# Patient Record
Sex: Male | Born: 1950 | Race: White | Hispanic: No | Marital: Married | Smoking: Former smoker
Health system: Southern US, Community
[De-identification: ages and names within clinical notes are randomized; demographics above are authoritative.]

## PROBLEM LIST (undated history)

## (undated) DIAGNOSIS — E291 Testicular hypofunction: Secondary | ICD-10-CM

## (undated) DIAGNOSIS — E118 Type 2 diabetes mellitus with unspecified complications: Secondary | ICD-10-CM

## (undated) DIAGNOSIS — C44621 Squamous cell carcinoma of skin of unspecified upper limb, including shoulder: Secondary | ICD-10-CM

## (undated) DIAGNOSIS — K5790 Diverticulosis of intestine, part unspecified, without perforation or abscess without bleeding: Secondary | ICD-10-CM

## (undated) DIAGNOSIS — N529 Male erectile dysfunction, unspecified: Secondary | ICD-10-CM

## (undated) DIAGNOSIS — E785 Hyperlipidemia, unspecified: Secondary | ICD-10-CM

## (undated) DIAGNOSIS — B029 Zoster without complications: Secondary | ICD-10-CM

## (undated) DIAGNOSIS — N200 Calculus of kidney: Secondary | ICD-10-CM

## (undated) DIAGNOSIS — K635 Polyp of colon: Secondary | ICD-10-CM

## (undated) HISTORY — DX: Zoster without complications: B02.9

## (undated) HISTORY — DX: Polyp of colon: K63.5

## (undated) HISTORY — DX: Male erectile dysfunction, unspecified: N52.9

## (undated) HISTORY — DX: Squamous cell carcinoma of skin of unspecified upper limb, including shoulder: C44.621

## (undated) HISTORY — PX: WISDOM TOOTH EXTRACTION: SHX21

## (undated) HISTORY — DX: Diverticulosis of intestine, part unspecified, without perforation or abscess without bleeding: K57.90

## (undated) HISTORY — DX: Calculus of kidney: N20.0

## (undated) HISTORY — DX: Type 2 diabetes mellitus with unspecified complications: E11.8

## (undated) HISTORY — PX: COLONOSCOPY: SHX174

## (undated) HISTORY — DX: Hyperlipidemia, unspecified: E78.5

## (undated) HISTORY — PX: POLYPECTOMY: SHX149

## (undated) HISTORY — PX: VASECTOMY: SHX75

## (undated) HISTORY — DX: Testicular hypofunction: E29.1

---

## 2003-08-16 ENCOUNTER — Encounter: Payer: Self-pay | Admitting: Internal Medicine

## 2005-06-16 ENCOUNTER — Ambulatory Visit: Payer: Self-pay | Admitting: Internal Medicine

## 2005-06-24 ENCOUNTER — Ambulatory Visit: Payer: Self-pay | Admitting: Internal Medicine

## 2005-07-06 ENCOUNTER — Ambulatory Visit: Payer: Self-pay | Admitting: Internal Medicine

## 2005-08-02 ENCOUNTER — Ambulatory Visit: Payer: Self-pay | Admitting: Internal Medicine

## 2005-08-23 ENCOUNTER — Encounter (INDEPENDENT_AMBULATORY_CARE_PROVIDER_SITE_OTHER): Payer: Self-pay | Admitting: Specialist

## 2005-08-23 ENCOUNTER — Ambulatory Visit: Payer: Self-pay | Admitting: Internal Medicine

## 2005-08-23 LAB — HM COLONOSCOPY

## 2005-08-25 ENCOUNTER — Ambulatory Visit: Payer: Self-pay | Admitting: Internal Medicine

## 2005-08-28 ENCOUNTER — Emergency Department (HOSPITAL_COMMUNITY): Admission: EM | Admit: 2005-08-28 | Discharge: 2005-08-28 | Payer: Self-pay | Admitting: Emergency Medicine

## 2005-09-01 ENCOUNTER — Ambulatory Visit: Payer: Self-pay | Admitting: Internal Medicine

## 2006-07-07 ENCOUNTER — Ambulatory Visit: Payer: Self-pay | Admitting: Internal Medicine

## 2006-07-07 LAB — CONVERTED CEMR LAB
ALT: 10 units/L (ref 0–40)
AST: 19 units/L (ref 0–37)
Albumin: 4.1 g/dL (ref 3.5–5.2)
Alkaline Phosphatase: 62 units/L (ref 39–117)
BUN: 12 mg/dL (ref 6–23)
Basophils Absolute: 0 10*3/uL (ref 0.0–0.1)
Basophils Relative: 0.1 % (ref 0.0–1.0)
CO2: 29 meq/L (ref 19–32)
Calcium: 9.3 mg/dL (ref 8.4–10.5)
Chloride: 105 meq/L (ref 96–112)
Chol/HDL Ratio, serum: 3.3
Cholesterol: 134 mg/dL (ref 0–200)
Creatinine, Ser: 1 mg/dL (ref 0.4–1.5)
Eosinophil percent: 2.2 % (ref 0.0–5.0)
GFR calc non Af Amer: 82 mL/min
Glomerular Filtration Rate, Af Am: 99 mL/min/{1.73_m2}
Glucose, Bld: 95 mg/dL (ref 70–99)
HCT: 45.8 % (ref 39.0–52.0)
HDL: 40.3 mg/dL (ref >39.0)
Hemoglobin: 15.7 g/dL (ref 13.0–17.0)
LDL Cholesterol: 82 mg/dL (ref 0–99)
Lymphocytes Relative: 37.3 % (ref 12.0–46.0)
MCHC: 34.2 g/dL (ref 30.0–36.0)
MCV: 94.9 fL (ref 78.0–100.0)
Monocytes Absolute: 0.4 10*3/uL (ref 0.2–0.7)
Monocytes Relative: 8.2 % (ref 3.0–11.0)
Neutro Abs: 2.8 10*3/uL (ref 1.4–7.7)
Neutrophils Relative %: 52.2 % (ref 43.0–77.0)
PSA: 0.98 ng/mL (ref 0.10–4.00)
Platelets: 198 10*3/uL (ref 150–400)
Potassium: 4.1 meq/L (ref 3.5–5.1)
RBC: 4.83 M/uL (ref 4.22–5.81)
RDW: 12.4 % (ref 11.5–14.6)
Sodium: 140 meq/L (ref 135–145)
TSH: 0.46 microintl units/mL (ref 0.35–5.50)
Total Bilirubin: 1.1 mg/dL (ref 0.3–1.2)
Total Protein: 7.1 g/dL (ref 6.0–8.3)
Triglyceride fasting, serum: 59 mg/dL (ref 0–149)
VLDL: 12 mg/dL (ref 0–40)
WBC: 5.2 10*3/uL (ref 4.5–10.5)

## 2006-08-01 IMAGING — CT CT ABDOMEN W/O CM
2 of 4 series · 16 of 46 positions shown, 18 images · non-contrast
Comparison: None.

CLINICAL DATA: Right flank pain and right-sided abdominal pain. Microscopic
hematuria. Nausea. Colonoscopy 5 days ago.

CT OF THE ABDOMEN AND PELVIS WITHOUT CONTRAST 08/28/2005:
TECHNIQUE: Multidetector helical CT through the abdomen and pelvis was performed
without intravenous contrast. Oral contrast was given.

[Series 2: stone_wo 5.0 b40f st · axial · 0.81mm/px · z∈[-492,-72]mm · 13 of 115 slices shown, 15 images]
[im 5/115  soft-tissue]
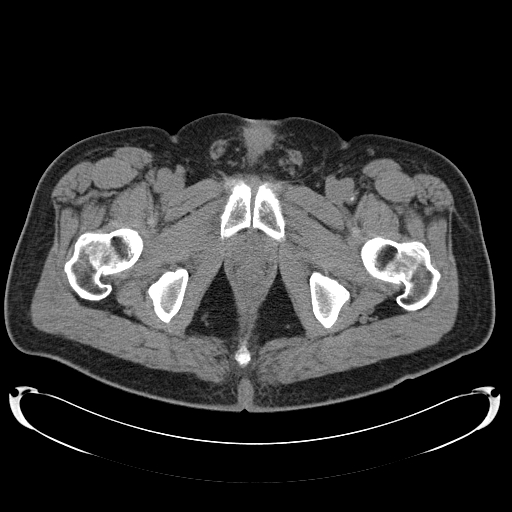
[im 5/115  bone]
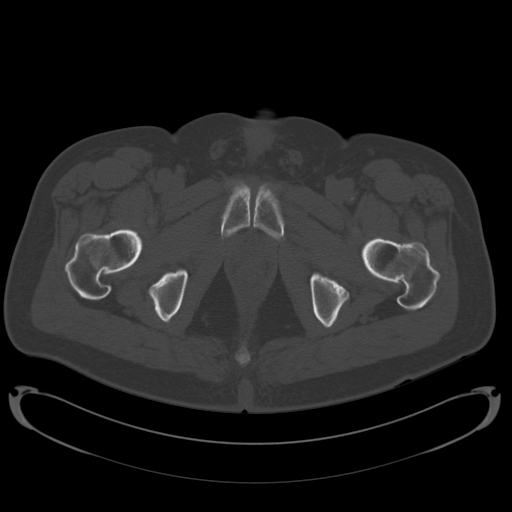
[im 15/115  soft-tissue]
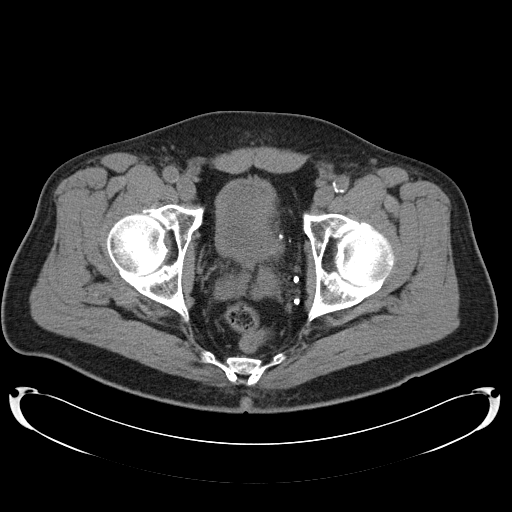
[im 24/115  soft-tissue]
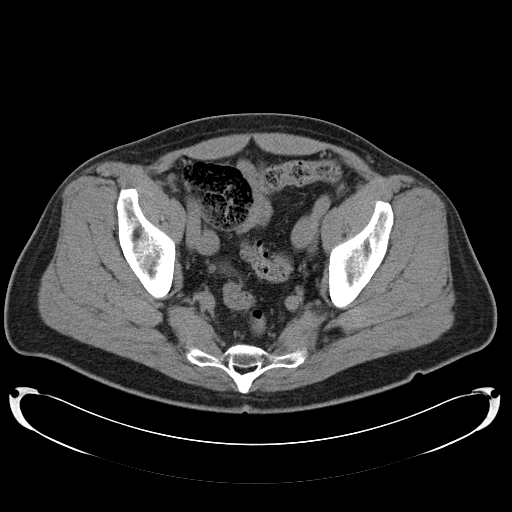
[im 34/115  soft-tissue]
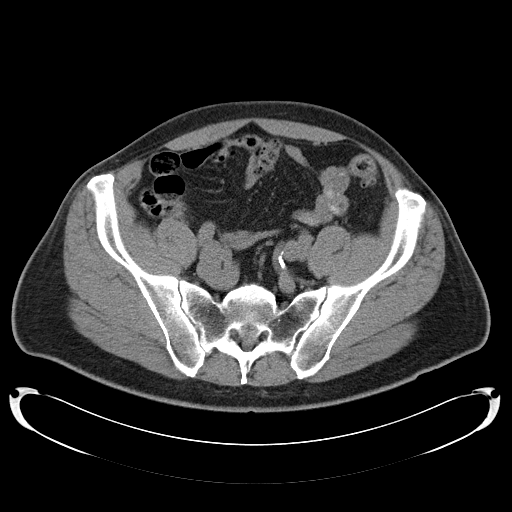
[im 39/115  soft-tissue]
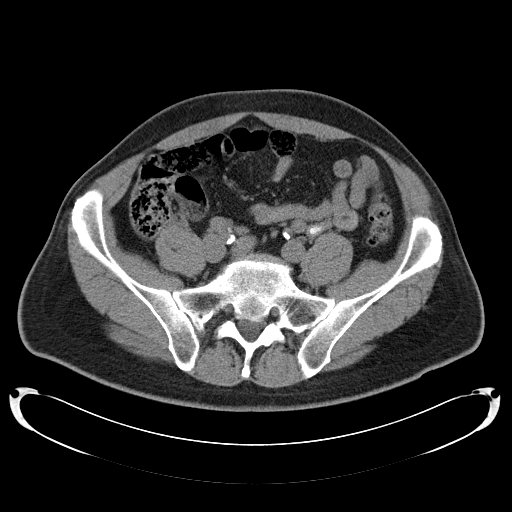
[im 48/115  soft-tissue]
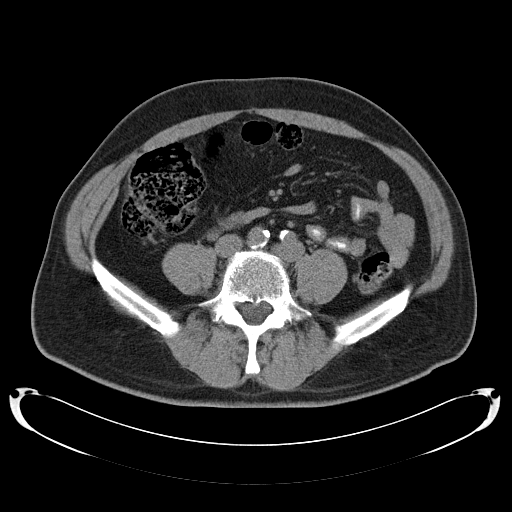
[im 58/115  soft-tissue]
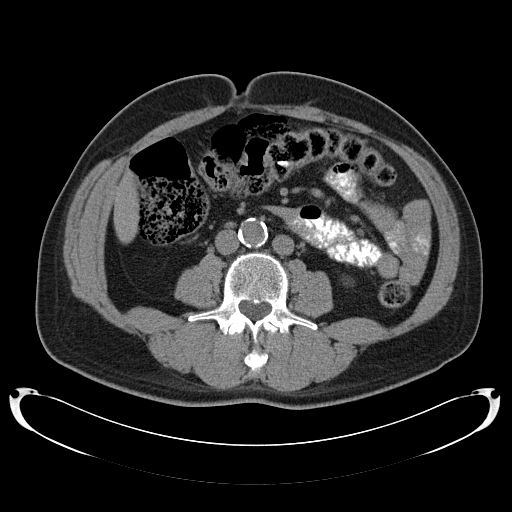
[im 67/115  soft-tissue]
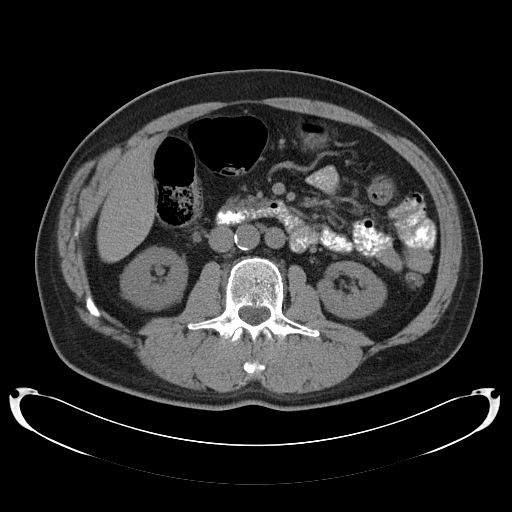
[im 77/115  soft-tissue]
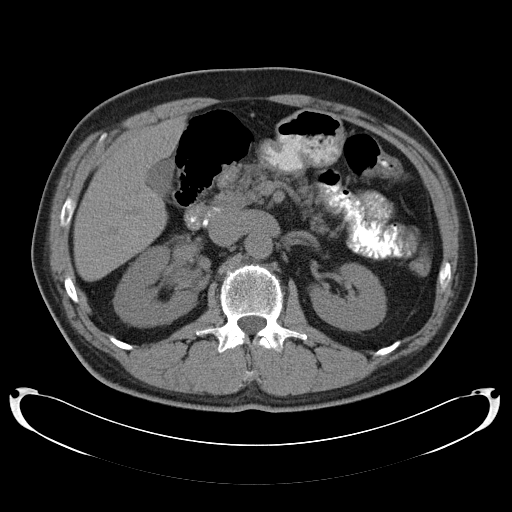
[im 77/115  bone]
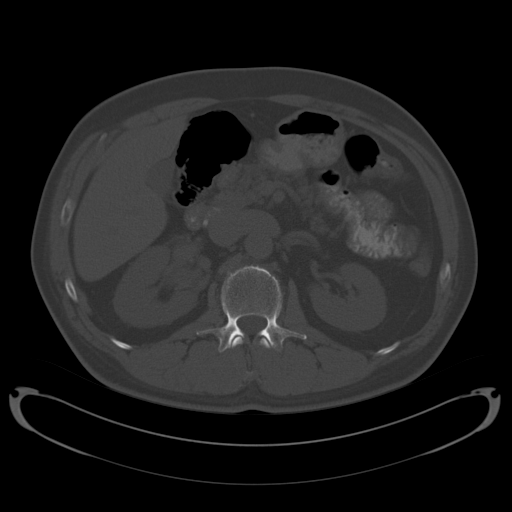
[im 81/115  soft-tissue]
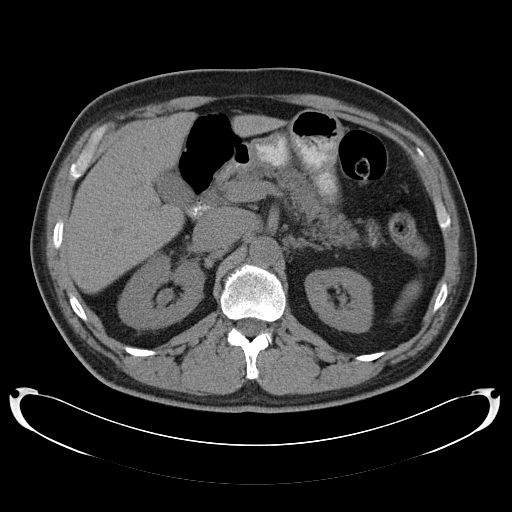
[im 91/115  soft-tissue]
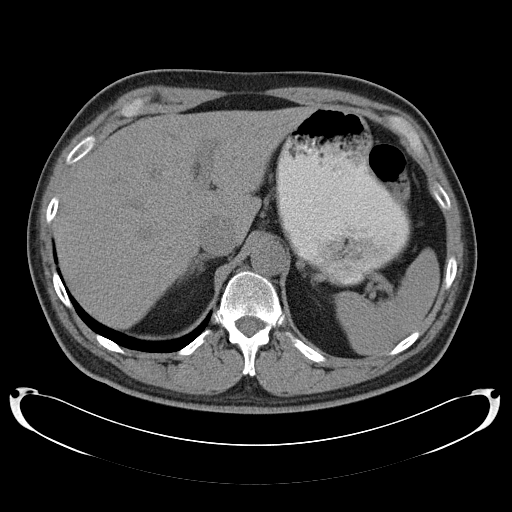
[im 100/115  soft-tissue]
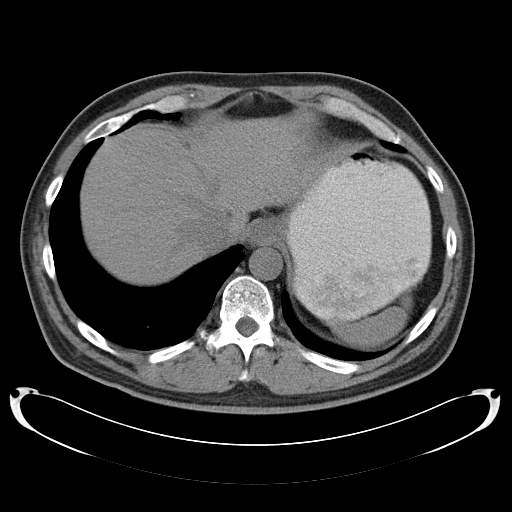
[im 110/115  soft-tissue]
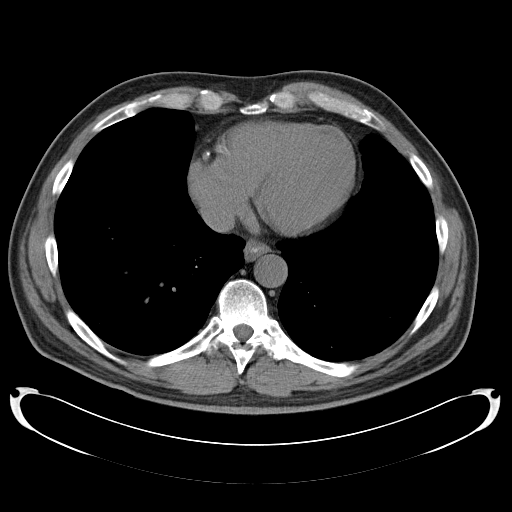

[Series 602: coronal abdomen · coronal · 0.93mm/px · 3 of 137 slices shown]
[im 46/137  soft-tissue]
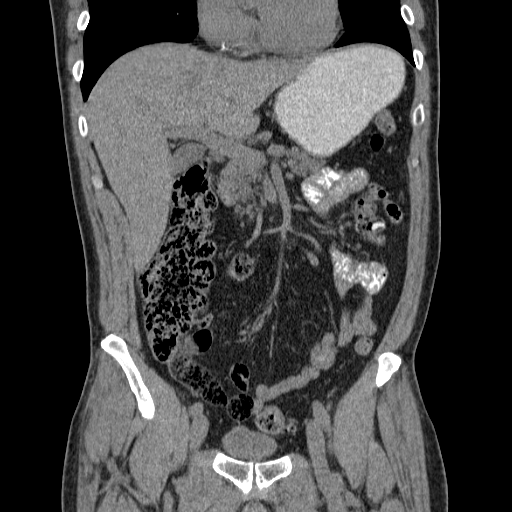
[im 61/137  soft-tissue]
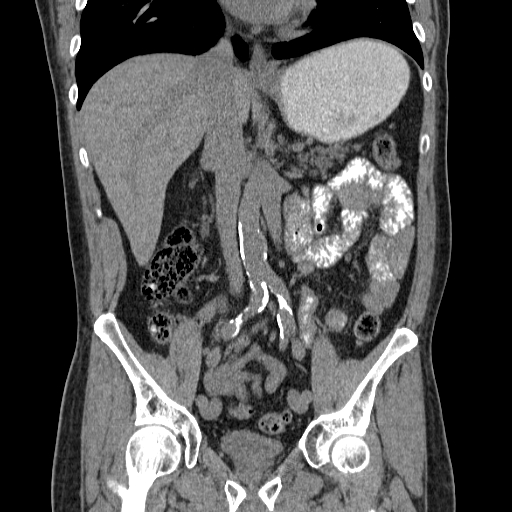
[im 76/137  soft-tissue]
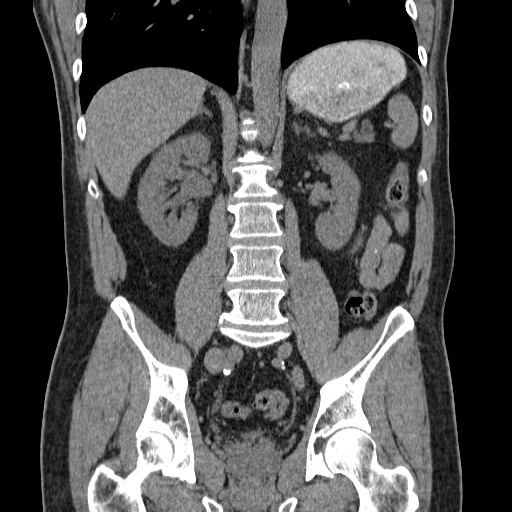

[16 of 46 positions shown; findings below may reference images not displayed]

CT ABDOMEN:

Moderate to severe right hydronephrosis is present. No proximal ureteral calculi
are identified on the right. A tiny (1-2 mm) calculus is noted in an upper pole
calyx of the right kidney. The right kidney is swollen and edematous, and there
is perinephric edema as well. There is a tiny (2 mm) calculus in a mid calyx of
the left kidney. No left hydronephrosis is present. Within the limits of the
unenhanced technique, no focal renal parenchymal abnormalities are identified.

There are simple cysts in the lateral segment and in the medial segment of the
left lobe of the liver; no significant hepatic abnormalities are identified,
again within the limits of the unenhanced technique. Aortic atherosclerosis is
present. There is a large amount of stool present throughout the visualized
colon which is otherwise unremarkable. The visualized upper abdomen is otherwise
unremarkable for the unenhanced technique. Images of the lung bases demonstrates
mild atelectasis. The heart is upper normal in size. Extensive right coronary
artery atherosclerotic calcification is noted.
IMPRESSION: 1. Moderate to severe right hydronephrosis. No proximal right ureteral calculi.
2. Tiny bilateral renal calculi.
3. No acute abnormalities otherwise in the upper abdomen apart from probable
constipation.

CT PELVIS:

There is a tiny (1-2 mm) calculus in the distal right ureter near the UVJ. This
accounts for the obstruction the right upper urinary tract. Numerous phleboliths
are also present in the pelvis. Prostate gland is moderately enlarged. Urinary
bladder is normal in appearance. Extensive distal descending and sigmoid colon
diverticulosis is present without evidence of diverticulitis. A large amount of
stool is present in the colon. There is no free fluid. There is no significant
lymphadenopathy. Iliofemoral atherosclerosis is present without aneurysm.
IMPRESSION: 1. Tiny (approximately 1-2 mm) obstructing distal right ureteral calculus.
2. Descending and sigmoid colon diverticulosis without evidence of
diverticulitis.
3. Prostate gland enlargement.
4. Probable constipation.

## 2006-08-18 ENCOUNTER — Ambulatory Visit: Payer: Self-pay | Admitting: Internal Medicine

## 2007-04-07 ENCOUNTER — Encounter: Payer: Self-pay | Admitting: Internal Medicine

## 2007-04-12 ENCOUNTER — Encounter: Payer: Self-pay | Admitting: Internal Medicine

## 2007-04-12 ENCOUNTER — Telehealth: Payer: Self-pay | Admitting: Internal Medicine

## 2007-04-14 ENCOUNTER — Ambulatory Visit: Payer: Self-pay | Admitting: Internal Medicine

## 2007-04-14 LAB — CONVERTED CEMR LAB
BUN: 14 mg/dL (ref 6–23)
Creatinine, Ser: 0.9 mg/dL (ref 0.4–1.5)

## 2007-04-17 ENCOUNTER — Ambulatory Visit: Payer: Self-pay | Admitting: Cardiovascular Disease

## 2007-04-25 ENCOUNTER — Ambulatory Visit: Payer: Self-pay | Admitting: Internal Medicine

## 2007-05-01 ENCOUNTER — Encounter: Payer: Self-pay | Admitting: Internal Medicine

## 2007-05-01 ENCOUNTER — Ambulatory Visit: Payer: Self-pay

## 2007-05-02 ENCOUNTER — Telehealth: Payer: Self-pay | Admitting: *Deleted

## 2007-08-21 ENCOUNTER — Ambulatory Visit: Payer: Self-pay | Admitting: Internal Medicine

## 2007-08-21 LAB — CONVERTED CEMR LAB
ALT: 14 units/L (ref 0–53)
AST: 21 units/L (ref 0–37)
Albumin: 3.8 g/dL (ref 3.5–5.2)
Alkaline Phosphatase: 60 units/L (ref 39–117)
BUN: 11 mg/dL (ref 6–23)
Basophils Absolute: 0 10*3/uL (ref 0.0–0.1)
Basophils Relative: 0 % (ref 0.0–1.0)
Bilirubin Urine: NEGATIVE
Bilirubin, Direct: 0.2 mg/dL (ref 0.0–0.3)
Blood in Urine, dipstick: NEGATIVE
CO2: 31 meq/L (ref 19–32)
Calcium: 8.9 mg/dL (ref 8.4–10.5)
Chloride: 105 meq/L (ref 96–112)
Cholesterol: 126 mg/dL (ref 0–200)
Creatinine, Ser: 1 mg/dL (ref 0.4–1.5)
Eosinophils Absolute: 0.2 10*3/uL (ref 0.0–0.6)
Eosinophils Relative: 3.2 % (ref 0.0–5.0)
GFR calc Af Amer: 99 mL/min
GFR calc non Af Amer: 82 mL/min
Glucose, Bld: 100 mg/dL — ABNORMAL HIGH (ref 70–99)
Glucose, Urine, Semiquant: NEGATIVE
HCT: 43.4 % (ref 39.0–52.0)
HDL: 29.3 mg/dL — ABNORMAL LOW (ref >39.0)
Hemoglobin: 14.3 g/dL (ref 13.0–17.0)
Ketones, urine, test strip: NEGATIVE
LDL Cholesterol: 76 mg/dL (ref 0–99)
Lymphocytes Relative: 32.8 % (ref 12.0–46.0)
MCHC: 33 g/dL (ref 30.0–36.0)
MCV: 96.6 fL (ref 78.0–100.0)
Monocytes Absolute: 0.6 10*3/uL (ref 0.2–0.7)
Monocytes Relative: 9.1 % (ref 3.0–11.0)
Neutro Abs: 3.3 10*3/uL (ref 1.4–7.7)
Neutrophils Relative %: 54.9 % (ref 43.0–77.0)
Nitrite: NEGATIVE
PSA: 1.29 ng/mL (ref 0.10–4.00)
Platelets: 143 10*3/uL — ABNORMAL LOW (ref 150–400)
Potassium: 4.2 meq/L (ref 3.5–5.1)
RBC: 4.49 M/uL (ref 4.22–5.81)
RDW: 12.4 % (ref 11.5–14.6)
Sodium: 141 meq/L (ref 135–145)
Specific Gravity, Urine: 1.025
TSH: 0.51 microintl units/mL (ref 0.35–5.50)
Total Bilirubin: 0.9 mg/dL (ref 0.3–1.2)
Total CHOL/HDL Ratio: 4.3
Total Protein: 6.4 g/dL (ref 6.0–8.3)
Triglycerides: 106 mg/dL (ref 0–149)
Urobilinogen, UA: 0.2
VLDL: 21 mg/dL (ref 0–40)
WBC Urine, dipstick: NEGATIVE
WBC: 6.1 10*3/uL (ref 4.5–10.5)
pH: 6

## 2007-08-28 ENCOUNTER — Ambulatory Visit: Payer: Self-pay | Admitting: Internal Medicine

## 2007-08-28 LAB — CONVERTED CEMR LAB
Cholesterol, target level: 200 mg/dL
HDL goal, serum: 40 mg/dL
LDL Goal: 70 mg/dL

## 2008-03-08 ENCOUNTER — Ambulatory Visit: Payer: Self-pay | Admitting: Internal Medicine

## 2008-03-08 LAB — CONVERTED CEMR LAB
ALT: 19 units/L (ref 0–53)
AST: 24 units/L (ref 0–37)
Albumin: 3.8 g/dL (ref 3.5–5.2)
Alkaline Phosphatase: 78 units/L (ref 39–117)
Bilirubin, Direct: 0.1 mg/dL (ref 0.0–0.3)
Cholesterol: 123 mg/dL (ref 0–200)
HDL: 28.4 mg/dL — ABNORMAL LOW (ref >39.0)
LDL Cholesterol: 74 mg/dL (ref 0–99)
Total Bilirubin: 1.1 mg/dL (ref 0.3–1.2)
Total CHOL/HDL Ratio: 4.3
Total Protein: 6.6 g/dL (ref 6.0–8.3)
Triglycerides: 101 mg/dL (ref 0–149)
VLDL: 20 mg/dL (ref 0–40)

## 2008-03-14 ENCOUNTER — Ambulatory Visit: Payer: Self-pay | Admitting: Internal Medicine

## 2008-08-23 ENCOUNTER — Ambulatory Visit: Payer: Self-pay | Admitting: Internal Medicine

## 2008-08-23 LAB — CONVERTED CEMR LAB
ALT: 12 units/L (ref 0–53)
AST: 19 units/L (ref 0–37)
Albumin: 3.8 g/dL (ref 3.5–5.2)
Alkaline Phosphatase: 66 units/L (ref 39–117)
BUN: 15 mg/dL (ref 6–23)
Basophils Absolute: 0 10*3/uL (ref 0.0–0.1)
Basophils Relative: 0.1 % (ref 0.0–3.0)
Bilirubin Urine: NEGATIVE
Bilirubin, Direct: 0.1 mg/dL (ref 0.0–0.3)
Blood in Urine, dipstick: NEGATIVE
CO2: 29 meq/L (ref 19–32)
Calcium: 8.9 mg/dL (ref 8.4–10.5)
Chloride: 105 meq/L (ref 96–112)
Cholesterol: 123 mg/dL (ref 0–200)
Creatinine, Ser: 1 mg/dL (ref 0.4–1.5)
Eosinophils Absolute: 0.1 10*3/uL (ref 0.0–0.7)
Eosinophils Relative: 2.5 % (ref 0.0–5.0)
GFR calc Af Amer: 98 mL/min
GFR calc non Af Amer: 81 mL/min
Glucose, Bld: 97 mg/dL (ref 70–99)
Glucose, Urine, Semiquant: NEGATIVE
HCT: 40.9 % (ref 39.0–52.0)
HDL: 30.8 mg/dL — ABNORMAL LOW (ref >39.0)
Hemoglobin: 14.3 g/dL (ref 13.0–17.0)
LDL Cholesterol: 72 mg/dL (ref 0–99)
Lymphocytes Relative: 35.3 % (ref 12.0–46.0)
MCHC: 35 g/dL (ref 30.0–36.0)
MCV: 95.4 fL (ref 78.0–100.0)
Monocytes Absolute: 0.4 10*3/uL (ref 0.1–1.0)
Monocytes Relative: 8.6 % (ref 3.0–12.0)
Neutro Abs: 2.5 10*3/uL (ref 1.4–7.7)
Neutrophils Relative %: 53.5 % (ref 43.0–77.0)
Nitrite: NEGATIVE
PSA: 1.18 ng/mL (ref 0.10–4.00)
Platelets: 186 10*3/uL (ref 150–400)
Potassium: 4.4 meq/L (ref 3.5–5.1)
RBC: 4.29 M/uL (ref 4.22–5.81)
RDW: 12.6 % (ref 11.5–14.6)
Sodium: 140 meq/L (ref 135–145)
Specific Gravity, Urine: 1.02
TSH: 0.35 microintl units/mL (ref 0.35–5.50)
Total Bilirubin: 0.9 mg/dL (ref 0.3–1.2)
Total CHOL/HDL Ratio: 4
Total Protein: 6.6 g/dL (ref 6.0–8.3)
Triglycerides: 103 mg/dL (ref 0–149)
Urobilinogen, UA: 0.2
VLDL: 21 mg/dL (ref 0–40)
WBC Urine, dipstick: NEGATIVE
WBC: 4.7 10*3/uL (ref 4.5–10.5)
pH: 7

## 2008-09-04 ENCOUNTER — Ambulatory Visit: Payer: Self-pay | Admitting: Internal Medicine

## 2009-03-04 ENCOUNTER — Ambulatory Visit: Payer: Self-pay | Admitting: Internal Medicine

## 2009-03-17 ENCOUNTER — Telehealth: Payer: Self-pay | Admitting: Internal Medicine

## 2009-04-04 ENCOUNTER — Ambulatory Visit: Payer: Self-pay | Admitting: Internal Medicine

## 2009-04-04 LAB — CONVERTED CEMR LAB
Cholesterol: 139 mg/dL (ref 0–200)
HDL: 35.4 mg/dL — ABNORMAL LOW (ref >39.00)
LDL Cholesterol: 82 mg/dL (ref 0–99)
Total CHOL/HDL Ratio: 4
Triglycerides: 108 mg/dL (ref 0.0–149.0)
VLDL: 21.6 mg/dL (ref 0.0–40.0)

## 2009-04-18 ENCOUNTER — Ambulatory Visit: Payer: Self-pay | Admitting: Internal Medicine

## 2009-09-10 ENCOUNTER — Ambulatory Visit: Payer: Self-pay | Admitting: Internal Medicine

## 2009-09-10 LAB — CONVERTED CEMR LAB
ALT: 16 units/L (ref 0–53)
AST: 24 units/L (ref 0–37)
Albumin: 3.8 g/dL (ref 3.5–5.2)
Alkaline Phosphatase: 65 units/L (ref 39–117)
BUN: 11 mg/dL (ref 6–23)
Basophils Absolute: 0 10*3/uL (ref 0.0–0.1)
Basophils Relative: 0 % (ref 0.0–3.0)
Bilirubin Urine: NEGATIVE
Bilirubin, Direct: 0.2 mg/dL (ref 0.0–0.3)
Blood in Urine, dipstick: NEGATIVE
CO2: 32 meq/L (ref 19–32)
Calcium: 9.1 mg/dL (ref 8.4–10.5)
Chloride: 109 meq/L (ref 96–112)
Cholesterol: 133 mg/dL (ref 0–200)
Creatinine, Ser: 0.9 mg/dL (ref 0.4–1.5)
Eosinophils Absolute: 0.1 10*3/uL (ref 0.0–0.7)
Eosinophils Relative: 2.7 % (ref 0.0–5.0)
GFR calc non Af Amer: 91.16 mL/min (ref >60)
Glucose, Bld: 99 mg/dL (ref 70–99)
Glucose, Urine, Semiquant: NEGATIVE
HCT: 42.8 % (ref 39.0–52.0)
HDL: 40.5 mg/dL (ref >39.00)
Hemoglobin: 14.1 g/dL (ref 13.0–17.0)
Ketones, urine, test strip: NEGATIVE
LDL Cholesterol: 67 mg/dL (ref 0–99)
Lymphocytes Relative: 27.7 % (ref 12.0–46.0)
Lymphs Abs: 1.5 10*3/uL (ref 0.7–4.0)
MCHC: 32.9 g/dL (ref 30.0–36.0)
MCV: 98.3 fL (ref 78.0–100.0)
Monocytes Absolute: 0.5 10*3/uL (ref 0.1–1.0)
Monocytes Relative: 10.1 % (ref 3.0–12.0)
Neutro Abs: 3.2 10*3/uL (ref 1.4–7.7)
Neutrophils Relative %: 59.5 % (ref 43.0–77.0)
Nitrite: NEGATIVE
PSA: 1.29 ng/mL (ref 0.10–4.00)
Platelets: 167 10*3/uL (ref 150.0–400.0)
Potassium: 5.1 meq/L (ref 3.5–5.1)
Protein, U semiquant: NEGATIVE
RBC: 4.35 M/uL (ref 4.22–5.81)
RDW: 12.7 % (ref 11.5–14.6)
Sodium: 143 meq/L (ref 135–145)
Specific Gravity, Urine: 1.025
TSH: 0.53 microintl units/mL (ref 0.35–5.50)
Total Bilirubin: 0.7 mg/dL (ref 0.3–1.2)
Total CHOL/HDL Ratio: 3
Total Protein: 6.8 g/dL (ref 6.0–8.3)
Triglycerides: 126 mg/dL (ref 0.0–149.0)
Urobilinogen, UA: 0.2
VLDL: 25.2 mg/dL (ref 0.0–40.0)
WBC Urine, dipstick: NEGATIVE
WBC: 5.3 10*3/uL (ref 4.5–10.5)
pH: 7

## 2009-10-03 ENCOUNTER — Ambulatory Visit: Payer: Self-pay | Admitting: Internal Medicine

## 2009-10-03 DIAGNOSIS — B029 Zoster without complications: Secondary | ICD-10-CM

## 2009-10-03 HISTORY — DX: Zoster without complications: B02.9

## 2010-07-24 ENCOUNTER — Encounter: Payer: Self-pay | Admitting: Internal Medicine

## 2010-08-25 NOTE — Progress Notes (Signed)
Summary: NEEDS OV  Phone Note Call from Patient Call back at [phone]   Caller: Patient-[facility] CALL Reason for Call: Acute Illness, Talk to Nurse Complaint: Breathing Problems Summary of Call: PT MISSED APPT ON 03-12-2009, AND WOULD LIKE TO BE WORKED IN SOONER THAN 04-25-2009. HE NEEDS A 6 MONTH OV-SO HE CAN BE FASTING. PLEASE ADVISE. Initial call taken by: Warnell Forester,  March 17, 2009 2:34 PM  Follow-up for Phone Call        may have one of the spots on sept 24--that is all we have-myave fasting lipids and liver 1 weeks prior 272.4 Follow-up by: Willy Eddy, LPN,  March 17, 2009 3:32 PM  Additional Follow-up for Phone Call Additional follow up Details #1::        APPTS ARE MADE PER PT. Additional Follow-up by: Warnell Forester,  March 17, 2009 4:25 PM

## 2010-08-25 NOTE — Assessment & Plan Note (Signed)
Summary: CPX/NJR reschedule with wife from [facility]   Vital Signs:  Patient Profile:   60 Years Old Male Height:     73 inches Weight:      222 pounds Temp:     98.5 degrees F oral Pulse rate:   68 / minute Resp:     14 per minute BP sitting:   120 / 72  (left arm)  Vitals Entered By: Willy Eddy, LPN (September 04, 2008 2:26 PM)                 Chief Complaint:  cpx--cardiolite in emr.    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  Past Surgical History:    Reviewed history from 08/28/2007 and no changes required:       colonoscopy       Colon polypectomy     Review of Systems  The patient denies anorexia, fever, weight loss, weight gain, vision loss, decreased hearing, hoarseness, chest pain, syncope, dyspnea on exertion, peripheral edema, prolonged cough, headaches, hemoptysis, abdominal pain, melena, hematochezia, severe indigestion/heartburn, hematuria, incontinence, genital sores, muscle weakness, suspicious skin lesions, transient blindness, difficulty walking, depression, unusual weight change, abnormal bleeding, enlarged lymph nodes, angioedema, breast masses, and testicular masses.     Physical Exam  General:     Well-developed,well-nourished,in no acute distress; alert,appropriate and cooperative throughout examination Head:     Normocephalic and atraumatic without obvious abnormalities. No apparent alopecia or balding. Eyes:     No corneal or conjunctival inflammation noted. EOMI. Perrla. Funduscopic exam benign, without hemorrhages, exudates or papilledema. Vision grossly normal. Nose:     External nasal examination shows no deformity or inflammation. Nasal mucosa are pink and moist without lesions or exudates. Mouth:     Oral  mucosa and oropharynx without lesions or exudates.  Teeth in good repair. Neck:     No deformities, masses, or tenderness noted. Lungs:     Normal respiratory effort, chest expands symmetrically. Lungs are clear to auscultation, no crackles or wheezes. Heart:     Normal rate and regular rhythm. S1 and S2 normal without gallop, murmur, click, rub or other extra sounds. Abdomen:     Bowel sounds positive,abdomen soft and non-tender without masses, organomegaly or hernias noted. Rectal:     No external abnormalities noted. Normal sphincter tone. No rectal masses or tenderness. Prostate:     Prostate gland firm and smooth, no enlargement, nodularity, tenderness, mass, asymmetry or induration. Msk:     No deformity or scoliosis noted of thoracic or lumbar spine.   Pulses:     R and L carotid,radial,femoral,dorsalis pedis and posterior tibial pulses are full and equal bilaterally Extremities:     No clubbing, cyanosis, edema, or deformity noted with normal full range of motion of all joints.   Neurologic:     No cranial nerve deficits noted. Station and gait are normal. Plantar reflexes are down-going bilaterally. DTRs are symmetrical throughout. Sensory, motor and coordinative functions appear intact.    Impression & Recommendations:  Problem # 1:  PREVENTIVE HEALTH CARE (ICD-V70.0)  Reviewed preventive care protocols, scheduled due services, and updated immunizations.   Problem #  2:  HYPERLIPIDEMIA (ICD-272.4)  His updated medication list for this problem includes:    Lipitor 40 Mg Tabs (Atorvastatin calcium) ..... One by mouth daily  Labs Reviewed: Chol: 123 (08/23/2008)   HDL: 30.8 (08/23/2008)   LDL: 72 (08/23/2008)   TG: 103 (08/23/2008) SGOT: 19 (08/23/2008)   SGPT: 12 (08/23/2008)  Lipid Goals: Chol Goal: 200 (08/28/2007)   HDL Goal: 40 (08/28/2007)   LDL Goal: 70 (08/28/2007)   TG Goal: 150 (08/28/2007)  Prior 10 Yr Risk Heart Disease: 6 % (08/28/2007)   Complete  Medication List: 1)  Aspir-81 81 Mg Tbec (Aspirin) .... Take 1 tablet by mouth once a day 2)  Lipitor 40 Mg Tabs (Atorvastatin calcium) .... One by mouth daily 3)  Beelith 362-20 Mg Tabs (Magnesium oxide-pyridoxine hcl) .... One by mouth bid   Patient Instructions: 1)  Please schedule a follow-up appointment in 6 months.     Preventive Care Screening  Colonoscopy:    Next Due:  08/2010

## 2010-08-25 NOTE — Assessment & Plan Note (Signed)
Summary: cpx-smm   Vital Signs:  Patient Profile:   61 Years Old Male Height:     73 inches Weight:      221 pounds Temp:     97.5 degrees F oral Pulse rate:   72 / minute Resp:     14 per minute BP sitting:   122 / 80  (left arm)  Vitals Entered By: Willy Eddy, LPN (August 28, 2007 8:32 AM)                 Chief Complaint:  cpx/colonosocopy 2008/dt in 2009.  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  Negative NCEP/ATP III risk factors include non-diabetic, non-tobacco-user status, no ASHD (atherosclerotic heart disease), no prior stroke/TIA, and no history of aortic aneurysm.     Current Allergies (reviewed today): No known allergies  Updated/Current Medications (including changes made in today's visit):  ASPIR-81 81 MG TBEC (ASPIRIN) Take 1 tablet by mouth once a day LIPITOR 40 MG  TABS (ATORVASTATIN CALCIUM) one by mouth daily BEELITH 362-20 MG  TABS (MAGNESIUM OXIDE-PYRIDOXINE HCL) one by mouth BID   Past Medical History:    Reviewed history from 03/08/2007 and no changes required:       Hyperlipidemia       High Cholesterol       Colonic polyps, hx of       Diverticulosis, colon  Past Surgical History:    Reviewed history from 03/08/2007 and no changes required:       colonoscopy       Colon polypectomy   Family History:    Reviewed history from 03/08/2007 and no changes required:       Family History of hyperlipidemia       Family History of CABG       Family History Hypertension       Family History of retinal prolapse       Family History of Cardiovascular disorder  Social History:  Reviewed history from 03/08/2007 and no changes required:       Retired       Married       Current Smoker       Alcohol use-no       Drug use-no   Risk Factors:  Tobacco use:  quit    Year quit:  2006 Passive smoke exposure:  no HIV high-risk behavior:  no  Family History Risk Factors:    Family History of MI in females < 82 years old:  no    Family History of MI in males < 47 years old:  yes  Colonoscopy History:     Date of Last Colonoscopy:  08/23/2005    Results:  Adenomatous Polyp    Review of Systems  The patient denies anorexia, fever, weight loss, vision loss, decreased hearing, hoarseness, chest pain, syncope, dyspnea on exhertion, peripheral edema, prolonged cough, hemoptysis, abdominal pain, melena, hematochezia, severe indigestion/heartburn, hematuria, incontinence, genital sores, muscle weakness, suspicious skin lesions, transient blindness, difficulty walking, depression, unusual weight change, abnormal bleeding, enlarged lymph nodes, angioedema, and breast masses.     Physical Exam  General:     Well-developed,well-nourished,in no  acute distress; alert,appropriate and cooperative throughout examination Head:     Normocephalic and atraumatic without obvious abnormalities. No apparent alopecia or balding. Eyes:     No corneal or conjunctival inflammation noted. EOMI. Perrla. Funduscopic exam benign, without hemorrhages, exudates or papilledema. Vision grossly normal. Ears:     External ear exam shows no significant lesions or deformities.  Otoscopic examination reveals clear canals, tympanic membranes are intact bilaterally without bulging, retraction, inflammation or discharge. Hearing is grossly normal bilaterally. Nose:     External nasal examination shows no deformity or inflammation. Nasal mucosa are pink and moist without lesions or exudates. Mouth:     Oral mucosa and oropharynx without lesions or exudates.  Teeth in good repair. Neck:     No  deformities, masses, or tenderness noted. Lungs:     Normal respiratory effort, chest expands symmetrically. Lungs are clear to auscultation, no crackles or wheezes. Heart:     Normal rate and regular rhythm. S1 and S2 normal without gallop, murmur, click, rub or other extra sounds. Abdomen:     Bowel sounds positive,abdomen soft and non-tender without masses, organomegaly or hernias noted. Msk:     No deformity or scoliosis noted of thoracic or lumbar spine.   Pulses:     R and L carotid,radial,femoral,dorsalis pedis and posterior tibial pulses are full and equal bilaterally Extremities:     No clubbing, cyanosis, edema, or deformity noted with normal full range of motion of all joints.   Neurologic:     No cranial nerve deficits noted. Station and gait are normal. Plantar reflexes are down-going bilaterally. DTRs are symmetrical throughout. Sensory, motor and coordinative functions appear intact.    Impression & Recommendations:  Problem # 1:  ATHEROSCLEROSIS, RENAL ARTERY (ICD-440.1) no current issues except stones  Problem # 2:  RENAL CALCULUS, HX OF (ICD-V13.01) on beelith for stone formation  Problem # 3:  Preventive Health Care (ICD-V70.0)  Reviewed preventive care protocols, scheduled due services, and updated immunizations. lot [id number] Exp 01/23/2008   Sanofi left deltoid   Problem # 4:  HYPERLIPIDEMIA (ICD-272.4)  His updated medication list for this problem includes:    Lipitor 40 Mg Tabs (Atorvastatin calcium) ..... One by mouth daily  Labs Reviewed: Chol: 126 (08/21/2007)   HDL: 29.3 (08/21/2007)   LDL: 76 (08/21/2007)   TG: 106 (08/21/2007) SGOT: 21 (08/21/2007)   SGPT: 14 (08/21/2007)  Lipid Goals: Chol Goal: 200 (08/28/2007)   HDL Goal: 40 (08/28/2007)   LDL Goal: 70 (08/28/2007)   TG Goal: 150 (08/28/2007)  10 Yr Risk Heart Disease: 9 %   Medications Added to Medication List This Visit: 1)  Beelith 362-20 Mg Tabs (Magnesium oxide-pyridoxine hcl) ....  One by mouth bid  Complete Medication List: 1)  Aspir-81 81 Mg Tbec (Aspirin) .... Take 1 tablet by mouth once a day 2)  Lipitor 40 Mg Tabs (Atorvastatin calcium) .... One by mouth daily 3)  Beelith 362-20 Mg Tabs (Magnesium oxide-pyridoxine hcl) .... One by mouth bid  Other Orders: Tdap => 48yrs IM (32671) Admin 1st Vaccine (24580)  Lipid Assessment/Plan:      Based on NCEP/ATP III, the patient's risk factor category is "history of coronary disease, peripheral vascular disease, cerebrovascular disease, or aortic aneurysm".  From this information, the patient's calculated lipid goals are as follows: Total cholesterol goal is 200; LDL cholesterol goal is 100; HDL cholesterol goal is 40; Triglyceride goal is 150.     Patient Instructions: 1)  six month folow up with  lipid and liver 2)  Please schedule a follow-up appointment in 6 months. 3)  Hepatic Panel prior to visit, ICD-9:  995.20 4)  Lipid Panel prior to visit, ICD-9:272.4    Prescriptions: LIPITOR 40 MG  TABS (ATORVASTATIN CALCIUM) one by mouth daily  #30 x 6   Entered and Authorized by:   Stacie Glaze MD   Signed by:   Stacie Glaze MD on 08/28/2007   Method used:   Print then Give to Patient   RxID:   [id number]  ]  Tetanus/Td Vaccine    Vaccine Type: Tdap    Site: left deltoid    Mfr: Sanofi Pasteur    Dose: 0.5 ml    Route: IM    Given by: Willy Eddy, LPN    Exp. Date: 06/21/2009    Lot #: [id number]  Influenza Vaccine    Vaccine Type: Fluvax Non-MCR    Given by: Willy Eddy, LPN  Flu Vaccine Consent Questions    Do you have a history of severe allergic reactions to this vaccine? no    Any prior history of allergic reactions to egg and/or gelatin? no    Do you have a sensitivity to the preservative Thimersol? no    Do you have a past history of Guillan-Barre Syndrome? no    Do you currently have an acute febrile illness? no    Have you ever had a severe reaction to latex? no    Vaccine  information given and explained to patient? yes lot [id number].Exp 01/23/2008 Sanofi pasteur-left deltoid IM.0.65ml ..................................................................Marland KitchenWilly Eddy, LPN  August 28, 2007 8:46 AM

## 2010-08-25 NOTE — Assessment & Plan Note (Signed)
Summary: cpx/njr//rsh from [facility]//lh rsc bmp/njr   Vital Signs:  Patient profile:   60 year old male Height:      73 inches Weight:      220 pounds BMI:     29.13 Temp:     98.1 degrees F oral Pulse rate:   72 / minute Resp:     14 per minute BP sitting:   120 / 80  (left arm)  Vitals Entered By: Willy Eddy, LPN (October 03, 2009 3:34 PM) CC: CPX   CC:  CPX.  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 Take 1 Tablet By Mouth Once A Day 2)  Lipitor 80 Mg Tabs (Atorvastatin Calcium) .... One By Mouth Daily 3)  Beelith 362-20 Mg  Tabs (Magnesium Oxide-Pyridoxine Hcl) .... One By Mouth Bid  Current Medications (verified): 1)  Aspir-81 81 Mg Tbec (Aspirin) .... Take 1 Tablet By Mouth Once A Day 2)  Lipitor 80 Mg Tabs (Atorvastatin Calcium) .... One By Mouth Daily 3)  Beelith 362-20 Mg  Tabs (Magnesium Oxide-Pyridoxine Hcl) .... One By Mouth Bid  Allergies (verified): No Known Drug Allergies  Past History:  Family History: Last updated: 03/08/2007 Family History of  hyperlipidemia Family History of CABG Family History Hypertension Family History of retinal prolapse Family History of Cardiovascular disorder  Social History: Last updated: 03/08/2007 Retired Married Current Smoker Alcohol use-no Drug use-no  Risk Factors: Smoking Status: quit (10/03/2009) Passive Smoke Exposure: no (10/03/2009)  Past medical, surgical, family and social histories (including risk factors) reviewed, and no changes noted (except as noted below).  Past Medical History: Reviewed history from 03/14/2008 and no changes required. 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 colonoscopy Colon polypectomy  Family History: Reviewed history from 03/08/2007 and no changes required. Family History of hyperlipidemia Family History of CABG Family History Hypertension Family History of retinal prolapse Family History of Cardiovascular disorder  Social History: Reviewed history from 03/08/2007 and no changes required. Retired Married Current Smoker Alcohol use-no Drug use-no  Review of Systems  The patient denies anorexia, fever, weight loss, weight gain, vision loss, decreased hearing, hoarseness, chest pain, syncope, dyspnea on exertion, peripheral edema, prolonged cough, headaches, hemoptysis, abdominal pain, melena, hematochezia, severe indigestion/heartburn, hematuria, incontinence, genital sores, muscle weakness, suspicious skin lesions, transient blindness, difficulty walking, depression, unusual weight change, abnormal bleeding, enlarged lymph nodes, angioedema, and breast masses.    Physical Exam  General:  Well-developed,well-nourished,in no acute distress; alert,appropriate and  cooperative throughout examination Head:  Normocephalic and atraumatic without obvious abnormalities. No apparent alopecia or  balding. Ears:  External ear exam shows no significant lesions or deformities.  Otoscopic examination reveals clear canals, tympanic membranes are intact bilaterally without bulging, retraction, inflammation or discharge. Hearing is grossly normal bilaterally. Mouth:  Oral mucosa and oropharynx without lesions or exudates.  Teeth in good repair. Lungs:  Normal respiratory effort, chest expands symmetrically. Lungs are clear to auscultation, no crackles or wheezes. Heart:  Normal rate and regular rhythm. S1 and S2 normal without gallop, murmur, click, rub or other extra sounds. Abdomen:  Bowel sounds positive,abdomen soft and non-tender without masses, organomegaly or hernias noted. Msk:  No deformity or scoliosis noted of thoracic or lumbar spine.   Pulses:  R and L carotid,radial,femoral,dorsalis pedis and posterior tibial pulses are full and equal bilaterally Extremities:  No clubbing, cyanosis, edema, or deformity noted with normal full range of motion of all joints.   Neurologic:  No cranial nerve deficits noted. Station and gait are normal. Plantar reflexes are down-going bilaterally. DTRs are symmetrical throughout. Sensory, motor and coordinative functions appear intact.   Impression & Recommendations:  Problem # 1:  PREVENTIVE HEALTH CARE (ICD-V70.0)  Colonoscopy: Adenomatous Polyp (08/23/2005) Td Booster: Tdap (08/28/2007)   Flu Vax: Fluvax 3+ (04/18/2009)   Chol: 133 (09/10/2009)   HDL: 40.50 (09/10/2009)   LDL: 67 (09/10/2009)   TG: 126.0 (09/10/2009) TSH: 0.53 (09/10/2009)   PSA: 1.29 (09/10/2009) Next Colonoscopy due:: 08/2010 (09/04/2008)  Discussed using sunscreen, use of alcohol, drug use, self testicular exam, routine dental care, routine eye care, routine physical exam, seat belts, multiple vitamins, osteoporosis prevention, adequate calcium intake in diet, and recommendations for immunizations.  Discussed exercise and checking cholesterol.  Discussed gun safety, safe sex, and  contraception. Also recommend checking PSA.  Complete Medication List: 1)  Aspir-81 81 Mg Tbec (Aspirin) .... Take 1 tablet by mouth once a day 2)  Lipitor 80 Mg Tabs (Atorvastatin calcium) .... One by mouth daily 3)  Beelith 362-20 Mg Tabs (Magnesium oxide-pyridoxine hcl) .... One by mouth bid  Other Orders: Venipuncture (54098) T- * Misc. Laboratory test [phone])  Patient Instructions: 1)  Please schedule a follow-up appointment  12 months    Prevention & Chronic Care Immunizations   Influenza vaccine: Fluvax 3+  (04/18/2009)    Tetanus booster: 08/28/2007: Tdap    Pneumococcal vaccine: Not documented    H. zoster vaccine: Not documented  Colorectal Screening   Hemoccult: Not documented    Colonoscopy: Adenomatous Polyp  (08/23/2005)   Colonoscopy due: 08/2010  Other Screening   PSA: 1.29  (09/10/2009)   Smoking status: quit  (10/03/2009)  Lipids   Total Cholesterol: 133  (09/10/2009)   LDL: 67  (09/10/2009)   LDL Direct: Not documented   HDL: 40.50  (09/10/2009)   Triglycerides: 126.0  (09/10/2009)    SGOT (AST): 24  (09/10/2009)   SGPT (ALT): 16  (09/10/2009)   Alkaline phosphatase: 65  (09/10/2009)   Total bilirubin: 0.7  (09/10/2009)  Self-Management Support :    Lipid self-management support: Not documented    Nursing Instructions: Give Pneumovax today    Appended Document: cpx/njr//rsh from bump//lh rsc bmp/njr       Immunizations Administered:  Pneumonia Vaccine:    Vaccine Type: Pneumovax    Site: right deltoid    Mfr: Merck    Dose: 0.5 ml    Route: IM    Given by: Willy Eddy, LPN  Exp. Date: 10/13/2010    Lot #: [id number]

## 2010-08-25 NOTE — Progress Notes (Signed)
Summary: results  Phone Note Call from Patient Call back at [phone]    Caller: Patient Call For: Dr Lovell Sheehan Reason for Call: Lab or Test Results Summary of Call: pt had a stress test on 10/6 would like to know the results  Initial call taken by: Shan Levans,  May 02, 2007 4:09 PM  Follow-up for Phone Call        pt informed  low risk stress ..................................................................Marland KitchenWilly Eddy, LPN  May 02, 2007 4:56 PM

## 2010-08-25 NOTE — Letter (Signed)
Summary: [facility] note  [facility] note   Imported By: Kassie Mends 04/18/2007 09:19:55  _____________________________________________________________________  External Attachment:    Type:   Image     Comment:   [facility]

## 2010-08-25 NOTE — Assessment & Plan Note (Signed)
Summary: 6 month rov/njr/PT RESCD/CCM   Vital Signs:  Patient Profile:   60 Years Old Male Height:     73 inches Weight:      219 pounds Temp:     98.5 degrees F oral Pulse rate:   76 / minute Resp:     14 per minute BP sitting:   130 / 80  (left arm)  Vitals Entered By: Willy Eddy, LPN (March 14, 2008 1:33 PM)                 Chief Complaint:  roa labs.    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  Past Surgical History:    Reviewed history from 08/28/2007 and no changes required:       colonoscopy       Colon polypectomy   Family History:    Reviewed history from 03/08/2007 and no changes required:       Family History of hyperlipidemia       Family History of CABG       Family History Hypertension       Family History of retinal prolapse       Family History of Cardiovascular disorder  Social History:    Reviewed history from 03/08/2007 and no changes required:       Retired       Married       Current Smoker       Alcohol use-no       Drug use-no     Physical Exam  General:     Well-developed,well-nourished,in no acute distress; alert,appropriate and cooperative throughout examination Head:     Normocephalic and atraumatic without obvious abnormalities. No apparent alopecia or balding. Eyes:     No corneal or conjunctival inflammation noted. EOMI. Perrla. Funduscopic exam benign, without hemorrhages, exudates or papilledema. Vision grossly normal. Ears:     External ear exam shows no significant lesions or deformities.  Otoscopic examination reveals clear canals, tympanic membranes are intact bilaterally without bulging, retraction, inflammation or discharge. Hearing is grossly normal bilaterally. Nose:     External nasal  examination shows no deformity or inflammation. Nasal mucosa are pink and moist without lesions or exudates. Mouth:     Oral mucosa and oropharynx without lesions or exudates.  Teeth in good repair. Neck:     No deformities, masses, or tenderness noted. Lungs:     Normal respiratory effort, chest expands symmetrically. Lungs are clear to auscultation, no crackles or wheezes. Heart:     Normal rate and regular rhythm. S1 and S2 normal without gallop, murmur, click, rub or other extra sounds. Abdomen:     Bowel sounds positive,abdomen soft and non-tender without masses, organomegaly or hernias noted.    Impression & Recommendations:  Problem # 1:  HYPERLIPIDEMIA (ICD-272.4) Assessment: Unchanged  His updated medication list for this problem includes:    Lipitor 40 Mg Tabs (Atorvastatin calcium) ..... 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 One by mouth daily  Labs Reviewed: Chol: 123 (03/08/2008)   HDL: 28.4 (03/08/2008)   LDL: 74 (03/08/2008)   TG: 101 (03/08/2008) SGOT: 24 (03/08/2008)   SGPT: 19 (03/08/2008)  Lipid Goals: Chol Goal: 200  (08/28/2007)   HDL Goal: 40 (08/28/2007)   LDL Goal: 70 (08/28/2007)   TG Goal: 150 (08/28/2007)  Prior 10 Yr Risk Heart Disease: 6 % (08/28/2007)   Complete Medication List: 1)  Aspir-81 81 Mg Tbec (Aspirin) .... Take 1 tablet by mouth once a day 2)  Lipitor 40 Mg Tabs (Atorvastatin calcium) .... One by mouth daily 3)  Beelith 362-20 Mg Tabs (Magnesium oxide-pyridoxine hcl) .... One by mouth bid   Patient Instructions: 1)  feb CPX WITH LABS PRIOR   ]

## 2010-08-25 NOTE — Progress Notes (Signed)
Summary: needs ov asap  Phone Note Call from Patient Call back at Home Phone [phone] Call back at [phone]   Caller: Spouse-rachel Call For: dr jj Summary of Call: pt has ct scan  at [facility] pt has kidneystones/on ct scan showed sludge in arteries to kidney and undetermined spots on liver and gallbladder.wife is request appt asap Initial call taken by: Heron Sabins,  April 12, 2007 2:24 PM  Follow-up for Phone Call        Lets get an CT angio  of renals and liver to look at liver masses and renal arteries then see him right after that... I called Follow-up by: Stacie Glaze MD,  April 13, 2007 8:04 AM  New Problems: ATHEROSCLEROSIS, RENAL ARTERY (ICD-440.1)   New Problems: ATHEROSCLEROSIS, RENAL ARTERY (ICD-440.1)

## 2010-08-25 NOTE — Assessment & Plan Note (Signed)
Summary: 6 month fup//ccm   Vital Signs:  Patient profile:   60 year old male Height:      73 inches Weight:      220 pounds BMI:     29.13 Temp:     98.3 degrees F oral Pulse rate:   72 / minute Resp:     14 per minute BP sitting:   140 / 80  (left arm)  Vitals Entered By: Willy Eddy, LPN (April 18, 2009 3:43 PM)  Nutrition Counseling: Patient's BMI is greater than 25 and therefore counseled on weight management options.  CC:  roa labs.  Problems Prior to Update: 1)  Dermatitis, Allergic  (ICD-692.9) 2)  Actinic Keratosis  (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 Take 1 Tablet By Mouth Once A Day 2)  Lipitor 80 Mg Tabs (Atorvastatin Calcium) .... One By Mouth Daily 3)  Beelith 362-20 Mg  Tabs (Magnesium Oxide-Pyridoxine Hcl) .... One By Mouth Bid 4)  Methylprednisolone 4 Mg Tabs (Methylprednisolone) .... 4 By Mouth For 2 Days Then 3 By Mouth For 2 Days The 2 By Mouth For 2 Days Then 1 By Mouth For 2 Days  Current Medications (verified): 1)  Aspir-81 81 Mg Tbec (Aspirin) .... Take 1 Tablet By Mouth Once A Day 2)  Lipitor 80 Mg Tabs (Atorvastatin Calcium) .... One By Mouth Daily 3)  Beelith 362-20 Mg  Tabs (Magnesium Oxide-Pyridoxine Hcl) .... One By Mouth Bid  Allergies (verified): No Known Drug Allergies  Past History:  Family History: Last updated: 03/08/2007 Family History of hyperlipidemia Family History of CABG Family History Hypertension Family History of retinal prolapse Family History of Cardiovascular disorder  Social History: Last updated: 03/08/2007 Retired Married Current Smoker Alcohol use-no Drug use-no  Risk Factors: Smoking Status: quit  (08/28/2007) Passive Smoke Exposure: no (08/28/2007)  Past medical, surgical, family and social histories (including risk factors) reviewed, and no changes noted (except as noted below).  Past Medical History: Reviewed history from 03/14/2008 and no changes required. 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 colonoscopy Colon polypectomy  Family History: Reviewed history from 03/08/2007 and no changes required. Family History of hyperlipidemia Family History of CABG Family History Hypertension Family History of retinal prolapse Family History of Cardiovascular disorder  Social History: Reviewed history from 03/08/2007 and no changes required. Retired Married Current Smoker Alcohol use-no Drug use-no  Review of Systems       Flu Vaccine Consent Questions     Do you have a history of severe allergic reactions to this vaccine? no    Any prior history of allergic reactions to egg and/or gelatin? no    Do you have a sensitivity to the preservative Thimersol? no    Do you have a past history of Guillan-Barre Syndrome? no    Do you currently have an acute febrile illness? no    Have you ever had a severe reaction to latex? no    Vaccine information given and explained to patient? yes    Are you currently pregnant? no    Lot Number:[id number]   Exp Date:01/22/2010  Site Given  Left Deltoid IM   Physical Exam  General:  Well-developed,well-nourished,in no acute distress; alert,appropriate and cooperative throughout examination Head:  Normocephalic and atraumatic without obvious abnormalities. No apparent alopecia or balding. Mouth:  Oral mucosa and oropharynx without lesions or exudates.  Teeth in good repair.   Impression & Recommendations:  Problem # 1:  HYPERLIPIDEMIA (ICD-272.4) Assessment Unchanged  His updated  medication list for this problem includes:    Lipitor 80 Mg Tabs (Atorvastatin calcium) ..... One by mouth daily  Labs Reviewed: SGOT: 19 (08/23/2008)   SGPT: 12 (08/23/2008)  Lipid Goals: Chol Goal: 200 (08/28/2007)   HDL Goal: 40 (08/28/2007)   LDL Goal: 70 (08/28/2007)   TG Goal: 150 (08/28/2007)  Prior 10 Yr Risk Heart Disease: 11 % (03/04/2009)   HDL:35.40 (04/04/2009), 30.8 (08/23/2008)  LDL:82 (04/04/2009), 72 (08/23/2008)  Chol:139 (04/04/2009), 123 (08/23/2008)  Trig:108.0 (04/04/2009), 103 (08/23/2008)  Complete Medication List: 1)  Aspir-81 81 Mg Tbec (Aspirin) .... Take 1 tablet by mouth once a day 2)  Lipitor 80 Mg Tabs (Atorvastatin calcium) .... One by mouth daily 3)  Beelith 362-20 Mg Tabs (Magnesium oxide-pyridoxine hcl) .... One by mouth bid  Other Orders: Admin 1st Vaccine (29528) Flu Vaccine 26yrs + [phone])  Patient Instructions: 1)  Jan 2011

## 2010-08-25 NOTE — Assessment & Plan Note (Signed)
Summary: possible shingles/bmw   Vital Signs:  Patient profile:   60 year old male Height:      73 inches Weight:      220 pounds BMI:     29.13 Temp:     98.2 degrees F rectal Pulse rate:   72 / minute Resp:     14 per minute BP sitting:   140 / 80  (left arm)  Vitals Entered By: Willy Eddy, LPN (March 04, 2009 10:50 AM)  CC:  rash on trunk of body with itching burning when in shower.  History of Present Illness: Diffuse macular rash over trunk possible drug reaction    Lipid Management History:      Positive NCEP/ATP III risk factors include male age 64 years old or older, HDL cholesterol less than 40, family history for ischemic heart disease (males less than 50 years old), and peripheral vascular disease.  Negative NCEP/ATP III risk factors include non-diabetic, non-tobacco-user status, no ASHD (atherosclerotic heart disease), no prior stroke/TIA, and no history of aortic aneurysm.     Allergies: No Known Drug Allergies  Past History:  Family History: Last updated: 03/08/2007 Family History of hyperlipidemia Family History of CABG Family History Hypertension Family History of retinal prolapse Family History of Cardiovascular disorder  Social History: Last updated: 03/08/2007 Retired Married Current Smoker Alcohol use-no Drug use-no  Risk Factors: Smoking Status: quit (08/28/2007) Passive Smoke Exposure: no (08/28/2007)  Past medical, surgical, family and social histories (including risk factors) reviewed, and no changes noted (except as noted below).  Past Medical History: Reviewed history from 03/14/2008 and no changes required. 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 colonoscopy Colon polypectomy  Family History: Reviewed history from 03/08/2007 and no changes required. Family History of  hyperlipidemia Family History of CABG Family History Hypertension Family History of retinal prolapse Family History of Cardiovascular disorder  Social History: Reviewed history from 03/08/2007 and no changes required. Retired Married Current Smoker Alcohol use-no Drug use-no  Review of Systems  The patient denies anorexia, fever, weight loss, weight gain, vision loss, decreased hearing, hoarseness, chest pain, syncope, dyspnea on exertion, peripheral edema, prolonged cough, headaches, hemoptysis, abdominal pain, melena, hematochezia, severe indigestion/heartburn, hematuria, incontinence, genital sores, muscle weakness, suspicious skin lesions, transient blindness, difficulty walking, depression, unusual weight change, abnormal bleeding, enlarged lymph nodes, angioedema, and breast masses.    Physical Exam  General:  Well-developed,well-nourished,in no acute distress; alert,appropriate and cooperative throughout examination Head:  Normocephalic and atraumatic without obvious abnormalities. No apparent alopecia or balding. Ears:  External ear exam shows no significant lesions or deformities.  Otoscopic examination reveals clear canals, tympanic membranes are intact bilaterally without bulging, retraction, inflammation or discharge. Hearing is grossly normal bilaterally. Mouth:  Oral mucosa and oropharynx without lesions or exudates.  Teeth in good repair. Skin:  macular rash.     Impression & Recommendations:  Problem # 1:  DERMATITIS, ALLERGIC (ICD-692.9)  His updated medication list for this problem includes:    Methylprednisolone 4 Mg Tabs (Methylprednisolone) .Marland KitchenMarland KitchenMarland KitchenMarland Kitchen 4 by mouth for 2 days then 3 by mouth for 2 days the 2 by mouth for 2 days then 1 by mouth for 2 days  Discussed avoidance of triggers and symptomatic treatment.   Complete Medication List: 1)  Aspir-81 81 Mg Tbec (Aspirin) .... Take 1 tablet by mouth once a day 2)  Lipitor 80 Mg Tabs (  Atorvastatin calcium) .... One  by mouth daily 3)  Beelith 362-20 Mg Tabs (Magnesium oxide-pyridoxine hcl) .... One by mouth bid 4)  Methylprednisolone 4 Mg Tabs (Methylprednisolone) .... 4 by mouth for 2 days then 3 by mouth for 2 days the 2 by mouth for 2 days then 1 by mouth for 2 days  Lipid Assessment/Plan:      Based on NCEP/ATP III, the patient's risk factor category is "history of coronary disease, peripheral vascular disease, cerebrovascular disease, or aortic aneurysm".  The patient's lipid goals are as follows: Total cholesterol goal is 200; LDL cholesterol goal is 70; HDL cholesterol goal is 40; Triglyceride goal is 150.    Patient Instructions: 1)  Please schedule a follow-up appointment as needed. Prescriptions: LIPITOR 80 MG TABS (ATORVASTATIN CALCIUM) one by mouth daily  #30 x 11   Entered and Authorized by:   Stacie Glaze MD   Signed by:   Stacie Glaze MD on 03/04/2009   Method used:   Print then Give to Patient   RxID:   [id number] METHYLPREDNISOLONE 4 MG TABS (METHYLPREDNISOLONE) 4 by mouth for 2 days then 3 by mouth for 2 days the 2 by mouth for 2 days then 1 by mouth for 2 days  #30 x 0   Entered and Authorized by:   Stacie Glaze MD   Signed by:   Stacie Glaze MD on 03/04/2009   Method used:   Electronically to        [facility]  [address] [phone]* (retail)       [address]       [place], [place]  [address]       Ph: [phone]       Fax: [phone]   RxID:   [id number]

## 2010-08-25 NOTE — Assessment & Plan Note (Signed)
Summary: ROA/FUP/RCD   Vital Signs:  Patient Profile:   60 Years Old Male Height:     73 inches Weight:      214 pounds Temp:     98.6 degrees F oral Pulse rate:   72 / minute Resp:     12 per minute BP sitting:   130 / 84  (left arm)  Vitals Entered By: Willy Eddy, LPN (April 25, 2007 8:09 AM)                 Current Allergies: No known allergies   Past Medical History:    Reviewed history from 03/08/2007 and no changes required:       Hyperlipidemia       High Cholesterol  Past Surgical History:    Reviewed history from 03/08/2007 and no changes required:       colonoscopy   Family History:    Reviewed history from 03/08/2007 and no changes required:       Family History of hyperlipidemia       Family History of CABG       Family History Hypertension       Family History of retinal prolapse       Family History of Cardiovascular disorder  Social History:    Reviewed history from 03/08/2007 and no changes required:       Retired       Married       Current Smoker       Alcohol use-no       Drug use-no    Review of Systems  The patient denies anorexia, fever, weight loss, vision loss, decreased hearing, hoarseness, chest pain, syncope, dyspnea on exhertion, peripheral edema, prolonged cough, hemoptysis, abdominal pain, melena, hematochezia, severe indigestion/heartburn, hematuria, incontinence, genital sores, muscle weakness, suspicious skin lesions, transient blindness, difficulty walking, depression, unusual weight change, abnormal bleeding, enlarged lymph nodes, angioedema, breast masses, and testicular masses.     Physical Exam  General:     Well-developed,well-nourished,in no acute distress; alert,appropriate and cooperative throughout examination Nose:     External nasal examination shows no deformity or inflammation. Nasal mucosa are pink and moist without lesions or exudates. Mouth:     Oral mucosa and oropharynx without lesions or  exudates.  Teeth in good repair. Neck:     No deformities, masses, or tenderness noted. Chest Wall:     No deformities, masses, tenderness or gynecomastia noted. Lungs:     Normal respiratory effort, chest expands symmetrically. Lungs are clear to auscultation, no crackles or wheezes. Heart:     Normal rate and regular rhythm. S1 and S2 normal without gallop, murmur, click, rub or other extra sounds.    Impression & Recommendations:  Problem # 1:  ATHEROSCLEROSIS, RENAL ARTERY (ICD-440.1) questionable finding on the CT will be checked by [place] Orders: Cardiology Referral (Cardiology)   Problem # 2:  HYPERLIPIDEMIA (ICD-272.4)  His updated medication list for this problem includes:    Lipitor 40 Mg Tabs (Atorvastatin calcium) ..... One by mouth daily  Orders: Cardiology Referral (Cardiology)   Complete Medication List: 1)  Aspir-81 81 Mg Tbec (Aspirin) .... Take 1 tablet by mouth once a day 2)  Lipitor 40 Mg Tabs (Atorvastatin calcium) .... One by mouth daily 3)  Therapeutic Multivitamin Tabs (Multiple vitamin) .... Once daily   Patient Instructions: 1)  Please schedule a follow-up appointment in .  CPX nov 2)  BMP prior to visit, ICD-9: 3)  Hepatic Panel prior  to visit, ICD-9: 4)  Lipid Panel prior to visit, ICD-9:  V70.0 5)  TSH prior to visit, ICD-9: 6)  CBC w/ Diff prior to visit, ICD-9: 7)  Urine-dip prior to visit, ICD-9: 8)  PSA prior to visit, ICD-9:     Prescriptions: LIPITOR 40 MG  TABS (ATORVASTATIN CALCIUM) one by mouth daily  #30 x 6   Entered and Authorized by:   Stacie Glaze MD   Signed by:   Stacie Glaze MD on 04/25/2007   Method used:   Print then Give to Patient   RxID:   [id number]  ]

## 2010-08-27 NOTE — Letter (Signed)
Summary: Colonoscopy Letter  [facility]  [address] [place], [place] [address]   Phone: [phone]  Fax: [phone]      July 24, 2010 MRN: [id number]   David Carey [address] [place], [place]  [address]   Dear David Carey,   According to your medical record, it is time for you to schedule a Colonoscopy. The American Cancer Society recommends this procedure as a method to detect early colon cancer. Patients with a family history of colon cancer, or a personal history of colon polyps or inflammatory bowel disease are at increased risk.  This letter has been generated based on the recommendations made at the time of your procedure. If you feel that in your particular situation this may no longer apply, please contact our office.  Please call our office at [phone] to schedule this appointment or to update your records at your earliest convenience.  Thank you for cooperating with [place] to provide you with the very best care possible.   Sincerely,  Wilhemina Bonito. Marina Goodell, M.D.  [facility] [phone]

## 2010-09-28 ENCOUNTER — Other Ambulatory Visit: Payer: BC Managed Care – PPO | Admitting: Internal Medicine

## 2010-09-28 DIAGNOSIS — Z0000 Encounter for general adult medical examination without abnormal findings: Secondary | ICD-10-CM

## 2010-09-28 LAB — BASIC METABOLIC PANEL
BUN: 11 mg/dL (ref 6–23)
CO2: 29 mEq/L (ref 19–32)
Calcium: 8.8 mg/dL (ref 8.4–10.5)
Chloride: 104 mEq/L (ref 96–112)
Creatinine, Ser: 0.9 mg/dL (ref 0.4–1.5)
GFR: 89.7 mL/min (ref >60.00)
Glucose, Bld: 100 mg/dL — ABNORMAL HIGH (ref 70–99)
Potassium: 4.4 mEq/L (ref 3.5–5.1)
Sodium: 139 mEq/L (ref 135–145)

## 2010-09-28 LAB — CBC WITH DIFFERENTIAL/PLATELET
Basophils Absolute: 0 10*3/uL (ref 0.0–0.1)
Basophils Relative: 0.2 % (ref 0.0–3.0)
Eosinophils Absolute: 0.2 10*3/uL (ref 0.0–0.7)
Eosinophils Relative: 2.9 % (ref 0.0–5.0)
HCT: 42.7 % (ref 39.0–52.0)
Hemoglobin: 14.6 g/dL (ref 13.0–17.0)
Lymphocytes Relative: 30.4 % (ref 12.0–46.0)
Lymphs Abs: 1.9 10*3/uL (ref 0.7–4.0)
MCHC: 34 g/dL (ref 30.0–36.0)
MCV: 96.8 fl (ref 78.0–100.0)
Monocytes Absolute: 0.5 10*3/uL (ref 0.1–1.0)
Monocytes Relative: 8.2 % (ref 3.0–12.0)
Neutro Abs: 3.6 10*3/uL (ref 1.4–7.7)
Neutrophils Relative %: 58.3 % (ref 43.0–77.0)
Platelets: 175 10*3/uL (ref 150.0–400.0)
RBC: 4.42 Mil/uL (ref 4.22–5.81)
RDW: 13.7 % (ref 11.5–14.6)
WBC: 6.2 10*3/uL (ref 4.5–10.5)

## 2010-09-28 LAB — POCT URINALYSIS DIPSTICK
Bilirubin, UA: NEGATIVE
Glucose, UA: NEGATIVE
Ketones, UA: NEGATIVE
Leukocytes, UA: NEGATIVE
Nitrite, UA: NEGATIVE
Protein, UA: NEGATIVE
Spec Grav, UA: 1.025
Urobilinogen, UA: 0.2
pH, UA: 5.5

## 2010-09-28 LAB — HEPATIC FUNCTION PANEL
ALT: 19 U/L (ref 0–53)
AST: 25 U/L (ref 0–37)
Albumin: 3.8 g/dL (ref 3.5–5.2)
Alkaline Phosphatase: 87 U/L (ref 39–117)
Bilirubin, Direct: 0.2 mg/dL (ref 0.0–0.3)
Total Bilirubin: 0.7 mg/dL (ref 0.3–1.2)
Total Protein: 6.5 g/dL (ref 6.0–8.3)

## 2010-09-28 LAB — TSH: TSH: 0.65 u[IU]/mL (ref 0.35–5.50)

## 2010-09-28 LAB — LIPID PANEL
Cholesterol: 137 mg/dL (ref 0–200)
HDL: 34 mg/dL — ABNORMAL LOW (ref >39.00)
LDL Cholesterol: 78 mg/dL (ref 0–99)
Total CHOL/HDL Ratio: 4
Triglycerides: 123 mg/dL (ref 0.0–149.0)
VLDL: 24.6 mg/dL (ref 0.0–40.0)

## 2010-09-28 LAB — PSA: PSA: 1.15 ng/mL (ref 0.10–4.00)

## 2010-10-02 ENCOUNTER — Encounter: Payer: Self-pay | Admitting: Internal Medicine

## 2010-10-05 ENCOUNTER — Encounter: Payer: Self-pay | Admitting: Internal Medicine

## 2010-10-05 ENCOUNTER — Ambulatory Visit (INDEPENDENT_AMBULATORY_CARE_PROVIDER_SITE_OTHER): Payer: BC Managed Care – PPO | Admitting: Internal Medicine

## 2010-10-05 VITALS — BP 135/80 | HR 68 | Temp 98.0°F | Resp 14 | Ht 76.0 in | Wt 234.0 lb

## 2010-10-05 DIAGNOSIS — E785 Hyperlipidemia, unspecified: Secondary | ICD-10-CM

## 2010-10-05 DIAGNOSIS — Z0000 Encounter for general adult medical examination without abnormal findings: Secondary | ICD-10-CM

## 2010-10-05 NOTE — Progress Notes (Signed)
  Subjective:    Patient ID: David Carey, male    DOB: [birth year], 60 y.o.   MRN: [id number]  HPI she presents for his yearly physical examination we reviewed his laboratory values his health maintenance issues his current weight and slight a list of blood pressure due to the gain of approximately 15 pounds since his last office visit we reviewed diet and health issues with the patient    Review of Systems  Constitutional: Negative for fever and fatigue.  HENT: Negative for hearing loss, congestion, neck pain and postnasal drip.   Eyes: Negative for discharge, redness and visual disturbance.  Respiratory: Negative for cough, shortness of breath and wheezing.   Cardiovascular: Negative for leg swelling.  Gastrointestinal: Negative for abdominal pain, constipation and abdominal distention.  Genitourinary: Negative for urgency and frequency.  Musculoskeletal: Negative for joint swelling and arthralgias.  Skin: Negative for color change and rash.  Neurological: Negative for weakness and light-headedness.  Hematological: Negative for adenopathy.  Psychiatric/Behavioral: Negative for behavioral problems.       Past Medical History  Diagnosis Date  . Hyperlipidemia   . Colon polyps   . Diverticulosis   . Squamous cell carcinoma of hand    Past Surgical History  Procedure Date  . Colon surgery     polypectomy    reports that he has quit smoking. He does not have any smokeless tobacco history on file. He reports that he does not drink alcohol or use illicit drugs. family history includes Heart disease in his father; Hyperlipidemia in his mother; and Retinal detachment in an unspecified family member. No Known Allergies  Objective:   Physical Exam  Constitutional: He is oriented to person, place, and time. He appears well-developed and well-nourished.        Weight gain of 15 pounds  HENT:  Head: Normocephalic and atraumatic.  Eyes: Conjunctivae are normal. Pupils are equal,  round, and reactive to light.  Neck: Normal range of motion. Neck supple.  Cardiovascular: Normal rate and regular rhythm.   Pulmonary/Chest: Effort normal and breath sounds normal.  Abdominal: Soft. Bowel sounds are normal.  Genitourinary: Rectum normal and prostate normal.  Musculoskeletal: Normal range of motion.  Neurological: He is alert and oriented to person, place, and time.  Skin: Skin is warm and dry.  Psychiatric: His behavior is normal. Thought content normal.          Assessment & Plan:   Patient presents for yearly preventative medicine examination.   all immunizations and health maintenance protocols were reviewed with the patient and they are up to date with these protocols.   screening laboratory values were reviewed with the patient including screening of hyperlipidemia PSA renal function and hepatic function.   There medications past medical history social history problem list and allergies were reviewed in detail.   Goals were established with regard to weight loss exercise diet in compliance with medications

## 2010-10-05 NOTE — Assessment & Plan Note (Signed)
The patient has history of hyperlipidemia treated with Lipitor 80 mg by mouth daily he has gained approximately 15 pounds since his last office visit and his HDL is down to 2 decreased exercise the LDL is up due to weight gain we discussed the impact of his weight on his overall cholesterol he is on maximum amount of Lipitor daily and would not change his Lipitor dose however we would intervene with suggesting weight loss and increasing exercise should this fail then an additive agent would be our next step he does have significant risk for coronary artery disease given family history

## 2011-01-05 ENCOUNTER — Other Ambulatory Visit: Payer: BC Managed Care – PPO

## 2011-01-29 ENCOUNTER — Other Ambulatory Visit: Payer: Self-pay | Admitting: Internal Medicine

## 2012-04-13 ENCOUNTER — Other Ambulatory Visit: Payer: Self-pay | Admitting: Internal Medicine

## 2012-04-14 ENCOUNTER — Encounter: Payer: Self-pay | Admitting: Internal Medicine

## 2012-05-23 ENCOUNTER — Encounter: Payer: Self-pay | Admitting: Internal Medicine

## 2012-05-23 ENCOUNTER — Telehealth: Payer: Self-pay | Admitting: Internal Medicine

## 2012-05-23 NOTE — Telephone Encounter (Signed)
Opened in error

## 2012-06-14 ENCOUNTER — Other Ambulatory Visit: Payer: BC Managed Care – PPO

## 2012-06-21 ENCOUNTER — Encounter: Payer: BC Managed Care – PPO | Admitting: Internal Medicine

## 2012-06-27 ENCOUNTER — Ambulatory Visit (AMBULATORY_SURGERY_CENTER): Payer: BC Managed Care – PPO

## 2012-06-27 VITALS — Ht 76.0 in | Wt 232.0 lb

## 2012-06-27 DIAGNOSIS — Z1211 Encounter for screening for malignant neoplasm of colon: Secondary | ICD-10-CM

## 2012-06-27 DIAGNOSIS — Z86010 Personal history of colonic polyps: Secondary | ICD-10-CM

## 2012-06-27 MED ORDER — MOVIPREP 100 G PO SOLR: ORAL | Status: DC

## 2012-06-29 ENCOUNTER — Encounter: Payer: BC Managed Care – PPO | Admitting: Internal Medicine

## 2012-07-03 ENCOUNTER — Encounter: Payer: Self-pay | Admitting: Internal Medicine

## 2012-07-03 ENCOUNTER — Ambulatory Visit (AMBULATORY_SURGERY_CENTER): Payer: BC Managed Care – PPO | Admitting: Internal Medicine

## 2012-07-03 VITALS — BP 118/78 | HR 58 | Temp 98.2°F | Resp 24 | Ht 76.0 in | Wt 234.0 lb

## 2012-07-03 DIAGNOSIS — Z86010 Personal history of colonic polyps: Secondary | ICD-10-CM

## 2012-07-03 DIAGNOSIS — D126 Benign neoplasm of colon, unspecified: Secondary | ICD-10-CM

## 2012-07-03 DIAGNOSIS — Z1211 Encounter for screening for malignant neoplasm of colon: Secondary | ICD-10-CM

## 2012-07-03 MED ORDER — SODIUM CHLORIDE 0.9 % IV SOLN: 500.0000 mL | INTRAVENOUS | Status: DC

## 2012-07-03 NOTE — Op Note (Signed)
[facility] [address] [place], [address]   COLONOSCOPY PROCEDURE REPORT  PATIENT: David, Carey  MR#: [id number] BIRTHDATE: 07-07-1951 , 61  yrs. old GENDER: Male ENDOSCOPIST: Roxy Cedar, MD REFERRED XB:[id number] Program Recall PROCEDURE DATE:  07/03/2012 PROCEDURE:   Colonoscopy with snare polypectomy    x 4 ASA CLASS:   Class II INDICATIONS:Patient's personal history of adenomatous colon polyps. Index 07-2005 w/ TA MEDICATIONS: MAC sedation, administered by CRNA and propofol (Diprivan) 230mg  IV  DESCRIPTION OF PROCEDURE:   After the risks benefits and alternatives of the procedure were thoroughly explained, informed consent was obtained.  A digital rectal exam revealed no abnormalities of the rectum.   The LB GIF-H180 [id number]  endoscope was introduced through the anus and advanced to the cecum, which was identified by both the appendix and ileocecal valve. No adverse events experienced.   The quality of the prep was good, using MoviPrep  The instrument was then slowly withdrawn as the colon was fully examined.      COLON FINDINGS: Four adenomatous appearring polyps ranging between 5-68mm in size were found at the cecum (1) and in the transverse colon (3)  A polypectomy was performed with a cold snare.  The resection was complete and the polyp tissue was completely retrieved.   Moderate diverticulosis was noted The finding was in the left colon.   The colon mucosa was otherwise normal. Retroflexed views revealed internal hemorrhoids. The time to cecum=3 minutes 52 seconds.  Withdrawal time=14 minutes 29 seconds. The scope was withdrawn and the procedure completed. COMPLICATIONS: There were no complications.  ENDOSCOPIC IMPRESSION: 1.   Four polyps ranging between 5-50mm in size were found at the cecum and in the transverse colon; polypectomy was performed with a cold snare 2.   Moderate diverticulosis was noted in the left colon 3.   The  colon mucosa was otherwise normal  RECOMMENDATIONS: 1. Repeat Colonoscopy in 3 years.   eSigned:  Roxy Cedar, MD 07/03/2012 3:01 PM  cc: Stacie Glaze, MD and The Patient   PATIENT NAME:  David Carey, David Carey MR#: [id number]

## 2012-07-03 NOTE — Progress Notes (Signed)
Called to room to assist during endoscopic procedure.  Patient ID and intended procedure confirmed with present staff. Received instructions for my participation in the procedure from the performing physician.  

## 2012-07-03 NOTE — Progress Notes (Signed)
Patient did not experience any of the following events: a burn prior to discharge; a fall within the facility; wrong site/side/patient/procedure/implant event; or a hospital transfer or hospital admission upon discharge from the facility. (G8907) Patient did not have preoperative order for IV antibiotic SSI prophylaxis. (G8918)  

## 2012-07-03 NOTE — Patient Instructions (Addendum)
Handouts were given to your care partner on polyps, diverticulosis and high fiber diet.   You may resume your current medications today.  Please call if any questions or concerns.   YOU HAD AN ENDOSCOPIC PROCEDURE TODAY AT THE [facility]: Refer to the procedure report that was given to you for any specific questions about what was found during the examination.  If the procedure report does not answer your questions, please call your gastroenterologist to clarify.  If you requested that your care partner not be given the details of your procedure findings, then the procedure report has been included in a sealed envelope for you to review at your convenience later.  YOU SHOULD EXPECT: Some feelings of bloating in the abdomen. Passage of more gas than usual.  Walking can help get rid of the air that was put into your GI tract during the procedure and reduce the bloating. If you had a lower endoscopy (such as a colonoscopy or flexible sigmoidoscopy) you may notice spotting of blood in your stool or on the toilet paper. If you underwent a bowel prep for your procedure, then you may not have a normal bowel movement for a few days.  DIET: Your first meal following the procedure should be a light meal and then it is ok to progress to your normal diet.  A half-sandwich or bowl of soup is an example of a good first meal.  Heavy or fried foods are harder to digest and may make you feel nauseous or bloated.  Likewise meals heavy in dairy and vegetables can cause extra gas to form and this can also increase the bloating.  Drink plenty of fluids but you should avoid alcoholic beverages for 24 hours.  ACTIVITY: Your care partner should take you home directly after the procedure.  You should plan to take it easy, moving slowly for the rest of the day.  You can resume normal activity the day after the procedure however you should NOT DRIVE or use heavy machinery for 24 hours (because of the sedation medicines  used during the test).    SYMPTOMS TO REPORT IMMEDIATELY: A gastroenterologist can be reached at any hour.  During normal business hours, 8:30 AM to 5:00 PM Monday through Friday, call [phone].  After hours and on weekends, please call the GI answering service at [phone] who will take a message and have the physician on call contact you.   Following lower endoscopy (colonoscopy or flexible sigmoidoscopy):  Excessive amounts of blood in the stool  Significant tenderness or worsening of abdominal pains  Swelling of the abdomen that is new, acute  Fever of 100F or higher    FOLLOW UP: If any biopsies were taken you will be contacted by phone or by letter within the next 1-3 weeks.  Call your gastroenterologist if you have not heard about the biopsies in 3 weeks.  Our staff will call the home number listed on your records the next business day following your procedure to check on you and address any questions or concerns that you may have at that time regarding the information given to you following your procedure. This is a courtesy call and so if there is no answer at the home number and we have not heard from you through the emergency physician on call, we will assume that you have returned to your regular daily activities without incident.  SIGNATURES/CONFIDENTIALITY: You and/or your care partner have signed paperwork which will be entered into   your electronic medical record.  These signatures attest to the fact that that the information above on your After Visit Summary has been reviewed and is understood.  Full responsibility of the confidentiality of this discharge information lies with you and/or your care-partner.  

## 2012-07-03 NOTE — Progress Notes (Signed)
No complaints noted in the recovery room. Maw   

## 2012-07-04 ENCOUNTER — Telehealth: Payer: Self-pay

## 2012-07-04 NOTE — Telephone Encounter (Signed)
  Follow up Call-  Call back number 07/03/2012  Post procedure Call Back phone  # [phone]  Permission to leave phone message Yes     Patient questions:  Do you have a fever, pain , or abdominal swelling? no Pain Score  0 *  Have you tolerated food without any problems? yes  Have you been able to return to your normal activities? yes  Do you have any questions about your discharge instructions: Diet   no Medications  no Follow up visit  no  Do you have questions or concerns about your Care? no  Actions: * If pain score is 4 or above: No action needed, pain <4.

## 2012-07-05 ENCOUNTER — Other Ambulatory Visit (INDEPENDENT_AMBULATORY_CARE_PROVIDER_SITE_OTHER): Payer: BC Managed Care – PPO

## 2012-07-05 DIAGNOSIS — Z0000 Encounter for general adult medical examination without abnormal findings: Secondary | ICD-10-CM

## 2012-07-05 DIAGNOSIS — E785 Hyperlipidemia, unspecified: Secondary | ICD-10-CM

## 2012-07-05 LAB — CBC WITH DIFFERENTIAL/PLATELET
Basophils Absolute: 0 10*3/uL (ref 0.0–0.1)
Basophils Relative: 0.3 % (ref 0.0–3.0)
Eosinophils Absolute: 0.1 10*3/uL (ref 0.0–0.7)
Eosinophils Relative: 1.7 % (ref 0.0–5.0)
HCT: 43 % (ref 39.0–52.0)
Hemoglobin: 14.4 g/dL (ref 13.0–17.0)
Lymphocytes Relative: 25.1 % (ref 12.0–46.0)
Lymphs Abs: 1.7 10*3/uL (ref 0.7–4.0)
MCHC: 33.4 g/dL (ref 30.0–36.0)
MCV: 93.9 fl (ref 78.0–100.0)
Monocytes Absolute: 0.5 10*3/uL (ref 0.1–1.0)
Monocytes Relative: 7.8 % (ref 3.0–12.0)
Neutro Abs: 4.3 10*3/uL (ref 1.4–7.7)
Neutrophils Relative %: 65.1 % (ref 43.0–77.0)
Platelets: 196 10*3/uL (ref 150.0–400.0)
RBC: 4.57 Mil/uL (ref 4.22–5.81)
RDW: 14.1 % (ref 11.5–14.6)
WBC: 6.6 10*3/uL (ref 4.5–10.5)

## 2012-07-05 LAB — HEPATIC FUNCTION PANEL
ALT: 20 U/L (ref 0–53)
AST: 26 U/L (ref 0–37)
Albumin: 3.8 g/dL (ref 3.5–5.2)
Alkaline Phosphatase: 79 U/L (ref 39–117)
Bilirubin, Direct: 0.2 mg/dL (ref 0.0–0.3)
Total Bilirubin: 1.3 mg/dL — ABNORMAL HIGH (ref 0.3–1.2)
Total Protein: 6.7 g/dL (ref 6.0–8.3)

## 2012-07-05 LAB — POCT URINALYSIS DIPSTICK
Bilirubin, UA: NEGATIVE
Blood, UA: NEGATIVE
Glucose, UA: NEGATIVE
Ketones, UA: NEGATIVE
Leukocytes, UA: NEGATIVE
Nitrite, UA: NEGATIVE
Protein, UA: NEGATIVE
Spec Grav, UA: 1.025
Urobilinogen, UA: 0.2
pH, UA: 6

## 2012-07-05 LAB — LIPID PANEL
Cholesterol: 140 mg/dL (ref 0–200)
HDL: 31.7 mg/dL — ABNORMAL LOW (ref >39.00)
LDL Cholesterol: 84 mg/dL (ref 0–99)
Total CHOL/HDL Ratio: 4
Triglycerides: 124 mg/dL (ref 0.0–149.0)
VLDL: 24.8 mg/dL (ref 0.0–40.0)

## 2012-07-05 LAB — BASIC METABOLIC PANEL
BUN: 9 mg/dL (ref 6–23)
CO2: 29 mEq/L (ref 19–32)
Calcium: 8.7 mg/dL (ref 8.4–10.5)
Chloride: 104 mEq/L (ref 96–112)
Creatinine, Ser: 0.9 mg/dL (ref 0.4–1.5)
GFR: 91.51 mL/min (ref >60.00)
Glucose, Bld: 98 mg/dL (ref 70–99)
Potassium: 4.2 mEq/L (ref 3.5–5.1)
Sodium: 141 mEq/L (ref 135–145)

## 2012-07-05 LAB — PSA: PSA: 1.4 ng/mL (ref 0.10–4.00)

## 2012-07-05 LAB — TSH: TSH: 0.57 u[IU]/mL (ref 0.35–5.50)

## 2012-07-10 ENCOUNTER — Encounter: Payer: Self-pay | Admitting: Internal Medicine

## 2012-07-24 ENCOUNTER — Ambulatory Visit (INDEPENDENT_AMBULATORY_CARE_PROVIDER_SITE_OTHER): Payer: BC Managed Care – PPO | Admitting: Internal Medicine

## 2012-07-24 ENCOUNTER — Encounter: Payer: Self-pay | Admitting: Internal Medicine

## 2012-07-24 VITALS — BP 150/90 | HR 68 | Temp 98.2°F | Resp 16 | Ht 76.0 in | Wt 236.0 lb

## 2012-07-24 DIAGNOSIS — N529 Male erectile dysfunction, unspecified: Secondary | ICD-10-CM

## 2012-07-24 DIAGNOSIS — Z23 Encounter for immunization: Secondary | ICD-10-CM

## 2012-07-24 DIAGNOSIS — E291 Testicular hypofunction: Secondary | ICD-10-CM

## 2012-07-24 DIAGNOSIS — Z0000 Encounter for general adult medical examination without abnormal findings: Secondary | ICD-10-CM

## 2012-07-24 NOTE — Progress Notes (Signed)
Subjective:    Patient ID: David Carey, male    DOB: 13-Jan-1951, 61 y.o.   MRN: [id number]  HPI  CPX Increased stress due to elder care  Review of Systems  Constitutional: Negative for fever and fatigue.  HENT: Negative for hearing loss, congestion, neck pain and postnasal drip.   Eyes: Negative for discharge, redness and visual disturbance.  Respiratory: Negative for cough, shortness of breath and wheezing.   Cardiovascular: Negative for leg swelling.  Gastrointestinal: Negative for abdominal pain, constipation and abdominal distention.  Genitourinary: Negative for urgency and frequency.  Musculoskeletal: Negative for joint swelling and arthralgias.  Skin: Negative for color change and rash.  Neurological: Negative for weakness and light-headedness.  Hematological: Negative for adenopathy.  Psychiatric/Behavioral: Negative for behavioral problems.   Past Medical History  Diagnosis Date  . Hyperlipidemia   . Colon polyps   . Diverticulosis   . 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 Kidney stones     History   Social History  . Marital Status: Married    Spouse Name: N/A    Number of Children: N/A  . Years of Education: N/A   Occupational History  . Not on file.   Social History Main Topics  . Smoking status: Former Smoker    Types: Cigarettes    Quit date: 06/27/1994  . Smokeless tobacco: Never Used     Comment: quit in 2002  . Alcohol Use: No  . Drug Use: No  . Sexually Active: Yes   Other Topics Concern  . Not on file   Social History Narrative  . No narrative on file    Past Surgical History  Procedure Date  . Polypectomy   . Colonoscopy   . Vasectomy   . Wisdom tooth extraction     Family History  Problem Relation Age of Onset  . Retinal detachment    . Hyperlipidemia Mother   . Heart disease Father     No Known Allergies  Current Outpatient Prescriptions on File Prior to Visit  Medication Sig Dispense Refill    . aspirin 81 MG tablet Take 81 mg by mouth daily.        Marland Kitchen atorvastatin (LIPITOR) 80 MG tablet       . magnesium oxide-pyridoxine (BEELITH) 362-20 MG TABS Take 1 tablet by mouth daily.          BP 150/90  Pulse 68  Temp 98.2 F (36.8 C)  Resp 16  Ht 6\' 4"  (1.93 m)  Wt 236 lb (107.049 kg)  BMI 28.73 kg/m2       Objective:   Physical Exam  Nursing note and vitals reviewed. Constitutional: He appears well-developed and well-nourished.  HENT:  Head: Normocephalic and atraumatic.  Eyes: Conjunctivae normal are normal. Pupils are equal, round, and reactive to light.  Neck: Normal range of motion. Neck supple.  Cardiovascular: Normal rate and regular rhythm.   Pulmonary/Chest: Effort normal and breath sounds normal.  Abdominal: Soft. Bowel sounds are normal.   Prostate moderate enlargement       Assessment & Plan:   Patient presents for yearly preventative medicine examination.   all immunizations and health maintenance protocols were reviewed with the patient and they are up to date with these protocols.   screening laboratory values were reviewed with the patient including screening of hyperlipidemia PSA renal function and hepatic function.   There medications past medical history social history problem list and allergies  were reviewed in detail.   Goals were established with regard to weight loss exercise diet in compliance with medications

## 2012-07-25 LAB — TESTOSTERONE, FREE, TOTAL, SHBG
Sex Hormone Binding: 34 nmol/L (ref 13–71)
Testosterone, Free: 45.3 pg/mL — ABNORMAL LOW (ref 47.0–244.0)
Testosterone-% Free: 1.9 % (ref 1.6–2.9)
Testosterone: 239.38 ng/dL — ABNORMAL LOW (ref 300–890)

## 2012-09-21 ENCOUNTER — Telehealth: Payer: Self-pay | Admitting: Internal Medicine

## 2012-09-21 NOTE — Telephone Encounter (Signed)
Pt had additional blood work done in dec 2013. Pt would like results

## 2012-09-21 NOTE — Telephone Encounter (Signed)
Low testosterone- will leave message for dr Lauro Regulus med is ordered send to [facility] in [place]

## 2012-09-22 MED ORDER — TESTOSTERONE 30 MG/ACT TD SOLN: 2.0000 | Freq: Every day | TRANSDERMAL | Status: DC

## 2012-09-22 NOTE — Telephone Encounter (Signed)
rx called in, pt aware 

## 2012-09-22 NOTE — Telephone Encounter (Signed)
Would benefit from testosterone replacement Call in topical testosterone to pharmacy Recheck testosterone  In 3 months

## 2012-10-26 ENCOUNTER — Telehealth: Payer: Self-pay | Admitting: Internal Medicine

## 2012-10-26 NOTE — Telephone Encounter (Signed)
Pt received letter from [facility] stating they will not pay for Testosterone 30 MG/ACT SOLN. (Axiron) They will pay androgel or androderm.  Pls advise   Pharm: [facility]: [address] [place]

## 2012-10-27 ENCOUNTER — Other Ambulatory Visit: Payer: Self-pay | Admitting: *Deleted

## 2012-10-27 MED ORDER — TESTOSTERONE 50 MG/5GM (1%) TD GEL: 5.0000 g | Freq: Every day | TRANSDERMAL | Status: DC

## 2012-10-27 NOTE — Telephone Encounter (Signed)
Called in the androgel pump-called to [facility] in [place] and told them you would start on pa

## 2012-11-03 NOTE — Telephone Encounter (Signed)
PA approved.

## 2013-04-30 ENCOUNTER — Telehealth: Payer: Self-pay | Admitting: Internal Medicine

## 2013-04-30 NOTE — Telephone Encounter (Signed)
Left message on machine with lab and physical appointment date and times.

## 2013-04-30 NOTE — Telephone Encounter (Signed)
Pt would like to have physical before the end of the year due to insurance changing. His last physical was 07/24/13, can we create a physical slot??  Please advise.  Thanks a bunch.  David Carey

## 2013-04-30 NOTE — Telephone Encounter (Signed)
May have 2 pm on dec 5th- have him come in at 1:45--thanks

## 2013-06-20 ENCOUNTER — Ambulatory Visit (INDEPENDENT_AMBULATORY_CARE_PROVIDER_SITE_OTHER): Payer: BC Managed Care – PPO

## 2013-06-20 ENCOUNTER — Other Ambulatory Visit (INDEPENDENT_AMBULATORY_CARE_PROVIDER_SITE_OTHER): Payer: BC Managed Care – PPO

## 2013-06-20 DIAGNOSIS — Z23 Encounter for immunization: Secondary | ICD-10-CM

## 2013-06-20 DIAGNOSIS — Z0000 Encounter for general adult medical examination without abnormal findings: Secondary | ICD-10-CM

## 2013-06-20 LAB — CBC WITH DIFFERENTIAL/PLATELET
Basophils Absolute: 0 10*3/uL (ref 0.0–0.1)
Basophils Relative: 0.3 % (ref 0.0–3.0)
Eosinophils Absolute: 0.1 10*3/uL (ref 0.0–0.7)
Eosinophils Relative: 1.4 % (ref 0.0–5.0)
HCT: 44.9 % (ref 39.0–52.0)
Hemoglobin: 15.2 g/dL (ref 13.0–17.0)
Lymphocytes Relative: 27.8 % (ref 12.0–46.0)
Lymphs Abs: 2.1 10*3/uL (ref 0.7–4.0)
MCHC: 33.8 g/dL (ref 30.0–36.0)
MCV: 93.7 fl (ref 78.0–100.0)
Monocytes Absolute: 0.6 10*3/uL (ref 0.1–1.0)
Monocytes Relative: 8.1 % (ref 3.0–12.0)
Neutro Abs: 4.8 10*3/uL (ref 1.4–7.7)
Neutrophils Relative %: 62.4 % (ref 43.0–77.0)
Platelets: 218 10*3/uL (ref 150.0–400.0)
RBC: 4.79 Mil/uL (ref 4.22–5.81)
RDW: 13.5 % (ref 11.5–14.6)
WBC: 7.7 10*3/uL (ref 4.5–10.5)

## 2013-06-20 LAB — BASIC METABOLIC PANEL
BUN: 15 mg/dL (ref 6–23)
CO2: 29 mEq/L (ref 19–32)
Calcium: 9.2 mg/dL (ref 8.4–10.5)
Chloride: 104 mEq/L (ref 96–112)
Creatinine, Ser: 0.9 mg/dL (ref 0.4–1.5)
GFR: 92.43 mL/min (ref >60.00)
Glucose, Bld: 103 mg/dL — ABNORMAL HIGH (ref 70–99)
Potassium: 4.2 mEq/L (ref 3.5–5.1)
Sodium: 137 mEq/L (ref 135–145)

## 2013-06-20 LAB — LIPID PANEL
Cholesterol: 142 mg/dL (ref 0–200)
HDL: 34.1 mg/dL — ABNORMAL LOW (ref >39.00)
LDL Cholesterol: 90 mg/dL (ref 0–99)
Total CHOL/HDL Ratio: 4
Triglycerides: 88 mg/dL (ref 0.0–149.0)
VLDL: 17.6 mg/dL (ref 0.0–40.0)

## 2013-06-20 LAB — POCT URINALYSIS DIPSTICK
Bilirubin, UA: NEGATIVE
Blood, UA: NEGATIVE
Glucose, UA: NEGATIVE
Ketones, UA: NEGATIVE
Leukocytes, UA: NEGATIVE
Nitrite, UA: NEGATIVE
Protein, UA: NEGATIVE
Spec Grav, UA: 1.02
Urobilinogen, UA: 0.2
pH, UA: 7

## 2013-06-20 LAB — HEPATIC FUNCTION PANEL
ALT: 18 U/L (ref 0–53)
AST: 22 U/L (ref 0–37)
Albumin: 3.8 g/dL (ref 3.5–5.2)
Alkaline Phosphatase: 86 U/L (ref 39–117)
Bilirubin, Direct: 0.1 mg/dL (ref 0.0–0.3)
Total Bilirubin: 0.9 mg/dL (ref 0.3–1.2)
Total Protein: 7.2 g/dL (ref 6.0–8.3)

## 2013-06-20 LAB — PSA: PSA: 1.55 ng/mL (ref 0.10–4.00)

## 2013-06-20 LAB — TSH: TSH: 0.49 u[IU]/mL (ref 0.35–5.50)

## 2013-06-22 ENCOUNTER — Other Ambulatory Visit: Payer: BC Managed Care – PPO

## 2013-06-29 ENCOUNTER — Ambulatory Visit (INDEPENDENT_AMBULATORY_CARE_PROVIDER_SITE_OTHER): Payer: BC Managed Care – PPO | Admitting: Internal Medicine

## 2013-06-29 ENCOUNTER — Encounter: Payer: Self-pay | Admitting: Internal Medicine

## 2013-06-29 VITALS — BP 134/90 | HR 72 | Temp 98.6°F | Resp 16 | Ht 76.0 in | Wt 236.0 lb

## 2013-06-29 DIAGNOSIS — Z0000 Encounter for general adult medical examination without abnormal findings: Secondary | ICD-10-CM

## 2013-06-29 MED ORDER — ATORVASTATIN CALCIUM 80 MG PO TABS: 80.0000 mg | ORAL_TABLET | Freq: Every day | ORAL | Status: DC

## 2013-06-29 NOTE — Progress Notes (Signed)
Pre visit review using our clinic review tool, if applicable. No additional management support is needed unless otherwise documented below in the visit note. 

## 2013-06-29 NOTE — Progress Notes (Signed)
Subjective:    Patient ID: David Carey, male    DOB: April 16, 1951, 62 y.o.   MRN: [id number]  HPI  wellness exam ED failed testosterone  Review of Systems  Constitutional: Negative for fever and fatigue.  HENT: Negative for congestion, hearing loss and postnasal drip.   Eyes: Negative for discharge, redness and visual disturbance.  Respiratory: Negative for cough, shortness of breath and wheezing.   Cardiovascular: Negative for leg swelling.  Gastrointestinal: Negative for abdominal pain, constipation and abdominal distention.  Genitourinary: Negative for urgency and frequency.  Musculoskeletal: Negative for arthralgias, joint swelling and neck pain.  Skin: Negative for color change and rash.  Neurological: Negative for weakness and light-headedness.  Hematological: Negative for adenopathy.  Psychiatric/Behavioral: Negative for behavioral problems.   Past Medical History  Diagnosis Date  . Hyperlipidemia   . Colon polyps   . Diverticulosis   . 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 Kidney stones     History   Social History  . Marital Status: Married    Spouse Name: N/A    Number of Children: N/A  . Years of Education: N/A   Occupational History  . Not on file.   Social History Main Topics  . Smoking status: Former Smoker    Types: Cigarettes    Quit date: 06/27/1994  . Smokeless tobacco: Never Used     Comment: quit in 2002  . Alcohol Use: No  . Drug Use: No  . Sexual Activity: Yes   Other Topics Concern  . Not on file   Social History Narrative  . No narrative on file    Past Surgical History  Procedure Laterality Date  . Polypectomy    . Colonoscopy    . Vasectomy    . Wisdom tooth extraction      Family History  Problem Relation Age of Onset  . Retinal detachment    . Hyperlipidemia Mother   . Heart disease Father     No Known Allergies  Current Outpatient Prescriptions on File Prior to Visit  Medication Sig  Dispense Refill  . aspirin 81 MG tablet Take 81 mg by mouth daily.        Marland Kitchen atorvastatin (LIPITOR) 80 MG tablet       . magnesium oxide-pyridoxine (BEELITH) 362-20 MG TABS Take 1 tablet by mouth daily.         No current facility-administered medications on file prior to visit.    BP 134/90  Pulse 72  Temp(Src) 98.6 F (37 C)  Resp 16  Ht 6\' 4"  (1.93 m)  Wt 236 lb (107.049 kg)  BMI 28.74 kg/m2        Objective:   Physical Exam  Nursing note and vitals reviewed. Constitutional: He is oriented to person, place, and time. He appears well-developed and well-nourished.  HENT:  Head: Normocephalic and atraumatic.  Eyes: Conjunctivae are normal. Pupils are equal, round, and reactive to light.  Neck: Normal range of motion. Neck supple.  Cardiovascular: Normal rate and regular rhythm.   Pulmonary/Chest: Effort normal and breath sounds normal.  Abdominal: Soft. Bowel sounds are normal.  Musculoskeletal: He exhibits tenderness. He exhibits no edema.  Neurological: He is alert and oriented to person, place, and time.  Skin: Skin is dry.  Psychiatric: He has a normal mood and affect. His behavior is normal.          Assessment & Plan:   Patient presents for  yearly preventative medicine examination.   all immunizations and health maintenance protocols were reviewed with the patient and they are up to date with these protocols.   screening laboratory values were reviewed with the patient including screening of hyperlipidemia PSA renal function and hepatic function.  ED with failure of testosterone replacement Trial of oral   There medications past medical history social history problem list and allergies were reviewed in detail.   Goals were established with regard to weight loss exercise diet in compliance with medications

## 2013-06-29 NOTE — Patient Instructions (Signed)
The patient is instructed to continue all medications as prescribed. Schedule followup with check out clerk upon leaving the clinic  

## 2013-10-23 ENCOUNTER — Encounter: Payer: Self-pay | Admitting: *Deleted

## 2013-11-22 ENCOUNTER — Encounter: Payer: Self-pay | Admitting: Internal Medicine

## 2013-11-26 ENCOUNTER — Telehealth: Payer: Self-pay | Admitting: Internal Medicine

## 2013-11-26 NOTE — Telephone Encounter (Addendum)
Amendment request forms and letter mailed to patient 11/26/13 [facility]  Amendment forms received verbally at [facility]  12/07/13 DAJ

## 2013-12-24 ENCOUNTER — Encounter: Payer: Self-pay | Admitting: Internal Medicine

## 2014-01-02 ENCOUNTER — Ambulatory Visit: Payer: BC Managed Care – PPO | Admitting: Internal Medicine

## 2014-01-28 ENCOUNTER — Telehealth: Payer: Self-pay | Admitting: Internal Medicine

## 2014-01-28 NOTE — Telephone Encounter (Signed)
01/24/2014 Chart Amendment approval letter mailed [facility]mf.

## 2014-02-04 ENCOUNTER — Encounter: Payer: Self-pay | Admitting: Internal Medicine

## 2014-02-04 ENCOUNTER — Ambulatory Visit (INDEPENDENT_AMBULATORY_CARE_PROVIDER_SITE_OTHER): Payer: Medicare Other | Admitting: Internal Medicine

## 2014-02-04 VITALS — BP 120/82 | HR 74 | Ht 74.0 in | Wt 226.0 lb

## 2014-02-04 DIAGNOSIS — Z23 Encounter for immunization: Secondary | ICD-10-CM

## 2014-02-04 DIAGNOSIS — E785 Hyperlipidemia, unspecified: Secondary | ICD-10-CM

## 2014-02-04 MED ORDER — AVANAFIL 100 MG PO TABS: 1.0000 | ORAL_TABLET | ORAL | Status: DC | PRN

## 2014-02-04 MED ORDER — ATORVASTATIN CALCIUM 40 MG PO TABS: 40.0000 mg | ORAL_TABLET | Freq: Every day | ORAL | Status: DC

## 2014-02-04 NOTE — Progress Notes (Signed)
   Subjective:    Patient ID: David Carey, male    DOB: [birth year], 63 y.o.   MRN: [id number]  HPI Follow up for lipid management has been on Lipitor 40 and in the past we have rxed 71 1/2 and this is not easy for him to do.    Review of Systems  Constitutional: Negative for fever and fatigue.  HENT: Negative for congestion, hearing loss and postnasal drip.   Eyes: Negative for discharge, redness and visual disturbance.  Respiratory: Negative for cough, shortness of breath and wheezing.   Cardiovascular: Negative for leg swelling.  Gastrointestinal: Negative for abdominal pain, constipation and abdominal distention.  Genitourinary: Negative for urgency and frequency.  Musculoskeletal: Negative for arthralgias, joint swelling and neck pain.  Skin: Negative for color change and rash.  Neurological: Negative for weakness and light-headedness.  Hematological: Negative for adenopathy.  Psychiatric/Behavioral: Negative for behavioral problems.   Past Medical History  Diagnosis Date  . Hyperlipidemia   . Colon polyps   . Diverticulosis   . 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 Kidney stones     History   Social History  . Marital Status: Married    Spouse Name: N/A    Number of Children: N/A  . Years of Education: N/A   Occupational History  . Not on file.   Social History Main Topics  . Smoking status: Former Smoker    Types: Cigarettes    Quit date: 06/27/1994  . Smokeless tobacco: Never Used     Comment: quit in 2002  . Alcohol Use: No  . Drug Use: No  . Sexual Activity: Yes   Other Topics Concern  . Not on file   Social History Narrative  . No narrative on file    Past Surgical History  Procedure Laterality Date  . Polypectomy    . Colonoscopy    . Vasectomy    . Wisdom tooth extraction      Family History  Problem Relation Age of Onset  . Retinal detachment    . Hyperlipidemia Mother   . Heart disease Father     No Known  Allergies  Current Outpatient Prescriptions on File Prior to Visit  Medication Sig Dispense Refill  . aspirin 81 MG tablet Take 81 mg by mouth daily.        Marland Kitchen atorvastatin (LIPITOR) 80 MG tablet Take 1 tablet (80 mg total) by mouth daily at 6 PM.  90 tablet  3  . magnesium oxide-pyridoxine (BEELITH) 362-20 MG TABS Take 1 tablet by mouth daily.         No current facility-administered medications on file prior to visit.    BP 120/82  Pulse 74  Ht 6\' 2"  (1.88 m)  Wt 226 lb (102.513 kg)  BMI 29.00 kg/m2       Objective:   Physical Exam  Constitutional: He appears well-developed and well-nourished.  HENT:  Head: Normocephalic and atraumatic.  Eyes: Conjunctivae are normal. Pupils are equal, round, and reactive to light.  Neck: Normal range of motion. Neck supple.  Cardiovascular: Normal rate and regular rhythm.   Pulmonary/Chest: Effort normal and breath sounds normal.  Abdominal: Soft. Bowel sounds are normal.          Assessment & Plan:  Lipid change lipitor to 40 Pneumonia vaccine CPX

## 2014-02-04 NOTE — Patient Instructions (Signed)
The patient is instructed to continue all medications as prescribed. Schedule followup with check out clerk upon leaving the clinic  

## 2014-02-04 NOTE — Progress Notes (Signed)
Pre visit review using our clinic review tool, if applicable. No additional management support is needed unless otherwise documented below in the visit note. 

## 2014-03-01 DIAGNOSIS — L570 Actinic keratosis: Secondary | ICD-10-CM | POA: Diagnosis not present

## 2014-03-01 DIAGNOSIS — D485 Neoplasm of uncertain behavior of skin: Secondary | ICD-10-CM | POA: Diagnosis not present

## 2014-03-01 DIAGNOSIS — Z85828 Personal history of other malignant neoplasm of skin: Secondary | ICD-10-CM | POA: Diagnosis not present

## 2014-03-01 DIAGNOSIS — L820 Inflamed seborrheic keratosis: Secondary | ICD-10-CM | POA: Diagnosis not present

## 2014-03-01 DIAGNOSIS — D239 Other benign neoplasm of skin, unspecified: Secondary | ICD-10-CM | POA: Diagnosis not present

## 2014-03-04 DIAGNOSIS — L821 Other seborrheic keratosis: Secondary | ICD-10-CM | POA: Diagnosis not present

## 2014-03-04 DIAGNOSIS — C44519 Basal cell carcinoma of skin of other part of trunk: Secondary | ICD-10-CM | POA: Diagnosis not present

## 2014-03-20 DIAGNOSIS — C44519 Basal cell carcinoma of skin of other part of trunk: Secondary | ICD-10-CM | POA: Diagnosis not present

## 2014-03-22 DIAGNOSIS — C44519 Basal cell carcinoma of skin of other part of trunk: Secondary | ICD-10-CM | POA: Diagnosis not present

## 2014-05-20 ENCOUNTER — Ambulatory Visit (INDEPENDENT_AMBULATORY_CARE_PROVIDER_SITE_OTHER): Payer: Medicare Other | Admitting: Internal Medicine

## 2014-05-20 ENCOUNTER — Encounter: Payer: Self-pay | Admitting: Internal Medicine

## 2014-05-20 VITALS — BP 142/82 | HR 66 | Temp 98.1°F | Wt 230.7 lb

## 2014-05-20 DIAGNOSIS — J209 Acute bronchitis, unspecified: Secondary | ICD-10-CM | POA: Diagnosis not present

## 2014-05-20 MED ORDER — DOXYCYCLINE HYCLATE 100 MG PO TABS
100.0000 mg | ORAL_TABLET | Freq: Two times a day (BID) | ORAL | Status: DC
Start: 2014-05-20 — End: 2015-08-26

## 2014-05-20 MED ORDER — HYDROCODONE-HOMATROPINE 5-1.5 MG/5ML PO SYRP: 5.0000 mL | ORAL_SOLUTION | ORAL | Status: DC | PRN

## 2014-05-20 NOTE — Progress Notes (Signed)
Pre visit review using our clinic review tool, if applicable. No additional management support is needed unless otherwise documented below in the visit note.  Chief Complaint  Patient presents with  . Cough    Started on Friday.  Has tried OTC Nyquil.  Very little production.    HPI: Patient David Carey  comes in today for SDA for  new problem evaluation. PCP NA  No established new pcp yet  onset  Friday 3 days ago .  Rattling in chest  Sore throat and developed and head congestion.  Tried otc no help  Wife concerned about his cough . minor if at all. Some sweat. s  No rigors  Chest is sore and tight at times  ROS: See pertinent positives and negatives per HPI. No hemoptysis  Syncope sob   Past Medical History  Diagnosis Date  . Hyperlipidemia   . Colon polyps   . Diverticulosis   . 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 Kidney stones     Family History  Problem Relation Age of Onset  . Retinal detachment    . Hyperlipidemia Mother   . Heart disease Father     History   Social History  . Marital Status: Married    Spouse Name: N/A    Number of Children: N/A  . Years of Education: N/A   Social History Main Topics  . Smoking status: Former Smoker    Types: Cigarettes    Quit date: 06/27/1994  . Smokeless tobacco: Never Used     Comment: quit in 2002  . Alcohol Use: No  . Drug Use: No  . Sexual Activity: Yes   Other Topics Concern  . None   Social History Narrative  . None    Outpatient Encounter Prescriptions as of 05/20/2014  Medication Sig  . aspirin 81 MG tablet Take 81 mg by mouth daily.    Marland Kitchen atorvastatin (LIPITOR) 40 MG tablet Take 1 tablet (40 mg total) by mouth daily at 6 PM.  . Avanafil (STENDRA) 100 MG TABS Take 1 tablet by mouth as needed.  . magnesium oxide-pyridoxine (BEELITH) 362-20 MG TABS Take 1 tablet by mouth daily.    Marland Kitchen doxycycline (VIBRA-TABS) 100 MG tablet Take 1 tablet (100 mg total) by mouth 2 (two) times  daily.  Marland Kitchen HYDROcodone-homatropine (HYCODAN) 5-1.5 MG/5ML syrup Take 5 mLs by mouth every 4 (four) hours as needed for cough. 1-2 tsp    EXAM:  BP 142/82  Pulse 66  Temp(Src) 98.1 F (36.7 C) (Oral)  Wt 230 lb 11.2 oz (104.645 kg)  SpO2 97%  Body mass index is 29.61 kg/(m^2).  GENERAL: vitals reviewed and listed above, alert, oriented, appears well hydrated and in no acute distress HEENT: atraumatic, conjunctiva  clear, no obvious abnormalities on inspection of external nose and ears OP : no lesion edema or exudate  minld runny nose  NECK: no obvious masses on inspection palpation  LUNGS: bilateral basilar muscial sounds rhinchi but bs = good air movement  CV: HRRR, no clubbing cyanosis or  peripheral edema nl cap refill  PSYCH: pleasant and cooperative, no obvious depression or anxiety  ASSESSMENT AND PLAN:  Discussed the following assessment and plan:  Acute bronchitis, unspecified organism - viral vs atypical unceratin can add antibiotic if worsening etc   -Patient advised to return or notify health care team  if symptoms worsen ,persist or new concerns arise. Cough med for comfort if  needed . Other sx meds reviewed .  No hx of underlying lung disease  Otherwise Disc getting flu vaccine next week .   Patient Instructions  Chest sounds like a wheezy bronchitis   Usually viral and self limiting .  But sometimes  From atypical bacteria and can get better with antibiotic.   Can wait and see how you do over the next few days add antibiotic if chest feeing worse .   Fluids rest cough med if wish for comfort .   Get flu vaccine next week or when possible.     Acute Bronchitis Bronchitis is inflammation of the airways that extend from the windpipe into the lungs (bronchi). The inflammation often causes mucus to develop. This leads to a cough, which is the most common symptom of bronchitis.  In acute bronchitis, the condition usually develops suddenly and goes away over time,  usually in a couple weeks. Smoking, allergies, and asthma can make bronchitis worse. Repeated episodes of bronchitis may cause further lung problems.  CAUSES Acute bronchitis is most often caused by the same virus that causes a cold. The virus can spread from person to person (contagious) through coughing, sneezing, and touching contaminated objects. SIGNS AND SYMPTOMS   Cough.   Fever.   Coughing up mucus.   Body aches.   Chest congestion.   Chills.   Shortness of breath.   Sore throat.  DIAGNOSIS  Acute bronchitis is usually diagnosed through a physical exam. Your health care provider will also ask you questions about your medical history. Tests, such as chest X-rays, are sometimes done to rule out other conditions.  TREATMENT  Acute bronchitis usually goes away in a couple weeks. Oftentimes, no medical treatment is necessary. Medicines are sometimes given for relief of fever or cough. Antibiotic medicines are usually not needed but may be prescribed in certain situations. In some cases, an inhaler may be recommended to help reduce shortness of breath and control the cough. A cool mist vaporizer may also be used to help thin bronchial secretions and make it easier to clear the chest.  HOME CARE INSTRUCTIONS  Get plenty of rest.   Drink enough fluids to keep your urine clear or pale yellow (unless you have a medical condition that requires fluid restriction). Increasing fluids may help thin your respiratory secretions (sputum) and reduce chest congestion, and it will prevent dehydration.   Take medicines only as directed by your health care provider.  If you were prescribed an antibiotic medicine, finish it all even if you start to feel better.  Avoid smoking and secondhand smoke. Exposure to cigarette smoke or irritating chemicals will make bronchitis worse. If you are a smoker, consider using nicotine gum or skin patches to help control withdrawal symptoms. Quitting  smoking will help your lungs heal faster.   Reduce the chances of another bout of acute bronchitis by washing your hands frequently, avoiding people with cold symptoms, and trying not to touch your hands to your mouth, nose, or eyes.   Keep all follow-up visits as directed by your health care provider.  SEEK MEDICAL CARE IF: Your symptoms do not improve after 1 week of treatment.  SEEK IMMEDIATE MEDICAL CARE IF:  You develop an increased fever or chills.   You have chest pain.   You have severe shortness of breath.  You have bloody sputum.   You develop dehydration.  You faint or repeatedly feel like you are going to pass out.  You develop repeated vomiting.  You develop a severe headache. MAKE SURE YOU:   Understand these instructions.  Will watch your condition.  Will get help right away if you are not doing well or get worse. Document Released: 08/19/2004 Document Revised: 11/26/2013 Document Reviewed: 01/02/2013 [employer] Patient Information 2015 [place], [place]. This information is not intended to replace advice given to you by your health care provider. Make sure you discuss any questions you have with your health care provider.      Standley Brooking. Dyland Panuco M.D.

## 2014-05-20 NOTE — Patient Instructions (Addendum)
Chest sounds like a wheezy bronchitis   Usually viral and self limiting .  But sometimes  From atypical bacteria and can get better with antibiotic.   Can wait and see how you do over the next few days add antibiotic if chest feeing worse .   Fluids rest cough med if wish for comfort .   Get flu vaccine next week or when possible.     Acute Bronchitis Bronchitis is inflammation of the airways that extend from the windpipe into the lungs (bronchi). The inflammation often causes mucus to develop. This leads to a cough, which is the most common symptom of bronchitis.  In acute bronchitis, the condition usually develops suddenly and goes away over time, usually in a couple weeks. Smoking, allergies, and asthma can make bronchitis worse. Repeated episodes of bronchitis may cause further lung problems.  CAUSES Acute bronchitis is most often caused by the same virus that causes a cold. The virus can spread from person to person (contagious) through coughing, sneezing, and touching contaminated objects. SIGNS AND SYMPTOMS   Cough.   Fever.   Coughing up mucus.   Body aches.   Chest congestion.   Chills.   Shortness of breath.   Sore throat.  DIAGNOSIS  Acute bronchitis is usually diagnosed through a physical exam. Your health care provider will also ask you questions about your medical history. Tests, such as chest X-rays, are sometimes done to rule out other conditions.  TREATMENT  Acute bronchitis usually goes away in a couple weeks. Oftentimes, no medical treatment is necessary. Medicines are sometimes given for relief of fever or cough. Antibiotic medicines are usually not needed but may be prescribed in certain situations. In some cases, an inhaler may be recommended to help reduce shortness of breath and control the cough. A cool mist vaporizer may also be used to help thin bronchial secretions and make it easier to clear the chest.  HOME CARE INSTRUCTIONS  Get plenty of  rest.   Drink enough fluids to keep your urine clear or pale yellow (unless you have a medical condition that requires fluid restriction). Increasing fluids may help thin your respiratory secretions (sputum) and reduce chest congestion, and it will prevent dehydration.   Take medicines only as directed by your health care provider.  If you were prescribed an antibiotic medicine, finish it all even if you start to feel better.  Avoid smoking and secondhand smoke. Exposure to cigarette smoke or irritating chemicals will make bronchitis worse. If you are a smoker, consider using nicotine gum or skin patches to help control withdrawal symptoms. Quitting smoking will help your lungs heal faster.   Reduce the chances of another bout of acute bronchitis by washing your hands frequently, avoiding people with cold symptoms, and trying not to touch your hands to your mouth, nose, or eyes.   Keep all follow-up visits as directed by your health care provider.  SEEK MEDICAL CARE IF: Your symptoms do not improve after 1 week of treatment.  SEEK IMMEDIATE MEDICAL CARE IF:  You develop an increased fever or chills.   You have chest pain.   You have severe shortness of breath.  You have bloody sputum.   You develop dehydration.  You faint or repeatedly feel like you are going to pass out.  You develop repeated vomiting.  You develop a severe headache. MAKE SURE YOU:   Understand these instructions.  Will watch your condition.  Will get help right away if you are not  doing well or get worse. Document Released: 08/19/2004 Document Revised: 11/26/2013 Document Reviewed: 01/02/2013 [facility] Patient Information 2015 [place], [place]. This information is not intended to replace advice given to you by your health care provider. Make sure you discuss any questions you have with your health care provider.

## 2014-05-28 ENCOUNTER — Ambulatory Visit: Payer: Medicare Other | Admitting: Family Medicine

## 2014-05-28 ENCOUNTER — Ambulatory Visit (INDEPENDENT_AMBULATORY_CARE_PROVIDER_SITE_OTHER): Payer: Medicare Other | Admitting: Family Medicine

## 2014-05-28 DIAGNOSIS — Z23 Encounter for immunization: Secondary | ICD-10-CM | POA: Diagnosis not present

## 2014-10-04 DIAGNOSIS — D225 Melanocytic nevi of trunk: Secondary | ICD-10-CM | POA: Diagnosis not present

## 2014-10-04 DIAGNOSIS — L821 Other seborrheic keratosis: Secondary | ICD-10-CM | POA: Diagnosis not present

## 2014-10-04 DIAGNOSIS — D2371 Other benign neoplasm of skin of right lower limb, including hip: Secondary | ICD-10-CM | POA: Diagnosis not present

## 2014-10-04 DIAGNOSIS — X32XXXA Exposure to sunlight, initial encounter: Secondary | ICD-10-CM | POA: Diagnosis not present

## 2014-10-04 DIAGNOSIS — D485 Neoplasm of uncertain behavior of skin: Secondary | ICD-10-CM | POA: Diagnosis not present

## 2014-10-04 DIAGNOSIS — Z85828 Personal history of other malignant neoplasm of skin: Secondary | ICD-10-CM | POA: Diagnosis not present

## 2014-10-04 DIAGNOSIS — Z08 Encounter for follow-up examination after completed treatment for malignant neoplasm: Secondary | ICD-10-CM | POA: Diagnosis not present

## 2014-10-04 DIAGNOSIS — L570 Actinic keratosis: Secondary | ICD-10-CM | POA: Diagnosis not present

## 2014-10-07 DIAGNOSIS — L821 Other seborrheic keratosis: Secondary | ICD-10-CM | POA: Diagnosis not present

## 2015-01-29 DIAGNOSIS — L820 Inflamed seborrheic keratosis: Secondary | ICD-10-CM | POA: Diagnosis not present

## 2015-01-29 DIAGNOSIS — R238 Other skin changes: Secondary | ICD-10-CM | POA: Diagnosis not present

## 2015-01-29 DIAGNOSIS — L538 Other specified erythematous conditions: Secondary | ICD-10-CM | POA: Diagnosis not present

## 2015-02-04 DIAGNOSIS — M25569 Pain in unspecified knee: Secondary | ICD-10-CM | POA: Diagnosis not present

## 2015-02-04 DIAGNOSIS — S83241A Other tear of medial meniscus, current injury, right knee, initial encounter: Secondary | ICD-10-CM | POA: Diagnosis not present

## 2015-02-07 DIAGNOSIS — M25561 Pain in right knee: Secondary | ICD-10-CM | POA: Diagnosis not present

## 2015-02-07 DIAGNOSIS — M25461 Effusion, right knee: Secondary | ICD-10-CM | POA: Diagnosis not present

## 2015-02-07 DIAGNOSIS — S83231A Complex tear of medial meniscus, current injury, right knee, initial encounter: Secondary | ICD-10-CM | POA: Diagnosis not present

## 2015-02-07 DIAGNOSIS — M25569 Pain in unspecified knee: Secondary | ICD-10-CM | POA: Diagnosis not present

## 2015-04-29 DIAGNOSIS — D224 Melanocytic nevi of scalp and neck: Secondary | ICD-10-CM | POA: Diagnosis not present

## 2015-04-29 DIAGNOSIS — Z85828 Personal history of other malignant neoplasm of skin: Secondary | ICD-10-CM | POA: Diagnosis not present

## 2015-04-29 DIAGNOSIS — L111 Transient acantholytic dermatosis [Grover]: Secondary | ICD-10-CM | POA: Diagnosis not present

## 2015-04-29 DIAGNOSIS — X32XXXA Exposure to sunlight, initial encounter: Secondary | ICD-10-CM | POA: Diagnosis not present

## 2015-04-29 DIAGNOSIS — L570 Actinic keratosis: Secondary | ICD-10-CM | POA: Diagnosis not present

## 2015-04-29 DIAGNOSIS — L821 Other seborrheic keratosis: Secondary | ICD-10-CM | POA: Diagnosis not present

## 2015-04-29 DIAGNOSIS — Z08 Encounter for follow-up examination after completed treatment for malignant neoplasm: Secondary | ICD-10-CM | POA: Diagnosis not present

## 2015-04-29 DIAGNOSIS — D485 Neoplasm of uncertain behavior of skin: Secondary | ICD-10-CM | POA: Diagnosis not present

## 2015-04-30 DIAGNOSIS — B079 Viral wart, unspecified: Secondary | ICD-10-CM | POA: Diagnosis not present

## 2015-04-30 DIAGNOSIS — L080 Pyoderma: Secondary | ICD-10-CM | POA: Diagnosis not present

## 2015-04-30 DIAGNOSIS — L821 Other seborrheic keratosis: Secondary | ICD-10-CM | POA: Diagnosis not present

## 2015-06-30 ENCOUNTER — Telehealth: Payer: Self-pay | Admitting: Internal Medicine

## 2015-06-30 MED ORDER — ATORVASTATIN CALCIUM 40 MG PO TABS: 40.0000 mg | ORAL_TABLET | Freq: Every day | ORAL | Status: DC

## 2015-06-30 NOTE — Telephone Encounter (Signed)
Pt would like to transfer to [facility] and start off with physical. Can I sch cpx?

## 2015-06-30 NOTE — Telephone Encounter (Signed)
Rx sent to pharmacy.  Called and spoke with pt and pt is aware.  

## 2015-06-30 NOTE — Telephone Encounter (Signed)
He is more than welcome to switch to me. I can not promise him that we will get to a physical during his first appointment, it is in fact pretty unlikely we will have time for both.

## 2015-06-30 NOTE — Telephone Encounter (Signed)
Pt has an appt with [facility] on 08-21-15. Pt needs refill on atorvastatin #90 send to [facility] in [place]

## 2015-07-23 DIAGNOSIS — H40003 Preglaucoma, unspecified, bilateral: Secondary | ICD-10-CM | POA: Diagnosis not present

## 2015-07-23 DIAGNOSIS — H5213 Myopia, bilateral: Secondary | ICD-10-CM | POA: Diagnosis not present

## 2015-07-23 DIAGNOSIS — H25013 Cortical age-related cataract, bilateral: Secondary | ICD-10-CM | POA: Diagnosis not present

## 2015-07-31 ENCOUNTER — Encounter: Payer: Self-pay | Admitting: Internal Medicine

## 2015-08-07 ENCOUNTER — Telehealth: Payer: Self-pay | Admitting: *Deleted

## 2015-08-07 NOTE — Telephone Encounter (Signed)
-----   Message from [name], DO sent at 08/07/2015 11:00 AM EST ----- Ok. Please schedule new patient visit in available time slot. They may schedule acute visit with any provider in interim if she feels needs acute eval of memory prior to npv. Thanks. ----- Message -----    From: Agnes Lawrence, CMA    Sent: 08/07/2015   9:44 AM      To: [name], DO  Dr Kim-pts wife Apolonio Schneiders was seen today and states you told her you would take her husband as a new patient. States she is concerned about his memory (please do not tell him she said this).

## 2015-08-07 NOTE — Telephone Encounter (Signed)
I called the pt and scheduled an appt for 1/31 and a message was sent via Mychart message to the pts wife regarding memory issues.

## 2015-08-21 ENCOUNTER — Ambulatory Visit: Payer: Medicare Other | Admitting: Adult Health

## 2015-08-26 ENCOUNTER — Ambulatory Visit (INDEPENDENT_AMBULATORY_CARE_PROVIDER_SITE_OTHER): Payer: Medicare Other | Admitting: Family Medicine

## 2015-08-26 ENCOUNTER — Encounter: Payer: Self-pay | Admitting: Family Medicine

## 2015-08-26 VITALS — BP 100/72 | HR 66 | Temp 97.8°F | Ht 73.25 in | Wt 232.4 lb

## 2015-08-26 DIAGNOSIS — Z7185 Encounter for immunization safety counseling: Secondary | ICD-10-CM

## 2015-08-26 DIAGNOSIS — E785 Hyperlipidemia, unspecified: Secondary | ICD-10-CM

## 2015-08-26 DIAGNOSIS — R209 Unspecified disturbances of skin sensation: Secondary | ICD-10-CM | POA: Diagnosis not present

## 2015-08-26 DIAGNOSIS — Z7689 Persons encountering health services in other specified circumstances: Secondary | ICD-10-CM

## 2015-08-26 DIAGNOSIS — R739 Hyperglycemia, unspecified: Secondary | ICD-10-CM | POA: Diagnosis not present

## 2015-08-26 DIAGNOSIS — N529 Male erectile dysfunction, unspecified: Secondary | ICD-10-CM | POA: Diagnosis not present

## 2015-08-26 DIAGNOSIS — Z23 Encounter for immunization: Secondary | ICD-10-CM

## 2015-08-26 DIAGNOSIS — Z7189 Other specified counseling: Secondary | ICD-10-CM

## 2015-08-26 DIAGNOSIS — R202 Paresthesia of skin: Secondary | ICD-10-CM | POA: Diagnosis not present

## 2015-08-26 DIAGNOSIS — E669 Obesity, unspecified: Secondary | ICD-10-CM | POA: Insufficient documentation

## 2015-08-26 LAB — BASIC METABOLIC PANEL
BUN: 11 mg/dL (ref 6–23)
CO2: 30 mEq/L (ref 19–32)
Calcium: 9.4 mg/dL (ref 8.4–10.5)
Chloride: 104 mEq/L (ref 96–112)
Creatinine, Ser: 0.9 mg/dL (ref 0.40–1.50)
GFR: 89.46 mL/min (ref >60.00)
Glucose, Bld: 130 mg/dL — ABNORMAL HIGH (ref 70–99)
Potassium: 4.7 mEq/L (ref 3.5–5.1)
Sodium: 141 mEq/L (ref 135–145)

## 2015-08-26 LAB — LIPID PANEL
Cholesterol: 156 mg/dL (ref 0–200)
HDL: 36.3 mg/dL — ABNORMAL LOW (ref >39.00)
LDL Cholesterol: 80 mg/dL (ref 0–99)
NonHDL: 119.29
Total CHOL/HDL Ratio: 4
Triglycerides: 194 mg/dL — ABNORMAL HIGH (ref 0.0–149.0)
VLDL: 38.8 mg/dL (ref 0.0–40.0)

## 2015-08-26 LAB — HEMOGLOBIN A1C: Hgb A1c MFr Bld: 6.4 % (ref 4.6–6.5)

## 2015-08-26 LAB — TSH: TSH: 0.61 u[IU]/mL (ref 0.35–4.50)

## 2015-08-26 NOTE — Progress Notes (Signed)
Pre visit review using our clinic review tool, if applicable. No additional management support is needed unless otherwise documented below in the visit note. 

## 2015-08-26 NOTE — Progress Notes (Signed)
HPI:  David Carey is here to establish care.  Last PCP and physical: Dr. Arnoldo Morale - last physical in 2015  Has the following chronic problems that require follow up and concerns today:  Hyperlipidemia/Obesity: -on this chronically along with asa -40mg  lipitor -no regular exercise, diet is so so  Occasional prickly sensation in plantar toes: -for < 6 months -usually at night - feels dry -wife thinks he has memory issues - he does not think this is an issue - denies forgetting names, places or misplacing things -denies: weakness, numbness,   Erectile Dysfunction: -100mg  prn from prior PCP - reports works well, works well -hx testosterone deficiency but did not respond to testosterone gel wit prior PCP  Hx nephrolithiasis: -saw urologist in the past and take mag for this  Has questions about shingles vaccine. Thinks never had chicken pox.  He reports he will call to set up his colonoscopy - he received letter for this   ROS negative for unless reported above: fevers, unintentional weight loss, hearing or vision loss, chest pain, palpitations, struggling to breath, hemoptysis, melena, hematochezia, hematuria, falls, loc, si, thoughts of self harm  Past Medical History  Diagnosis Date  . Hyperlipidemia   . Colon polyps   . Diverticulosis   . 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 Kidney stones   . Erectile dysfunction   . Hypogonadism male     failed testosterone tx per prior PCP notes  . HERPES ZOSTER 10/03/2009    Qualifier: Diagnosis of  By: Arnoldo Morale MD, Balinda Quails     Past Surgical History  Procedure Laterality Date  . Polypectomy    . Colonoscopy    . Vasectomy    . Wisdom tooth extraction      Family History  Problem Relation Age of Onset  . Retinal detachment    . Hyperlipidemia Mother   . Heart disease Father   . Stroke Father 42  . Dementia Mother     Social History   Social History  . Marital Status: Married    Spouse Name:  N/A  . Number of Children: N/A  . Years of Education: N/A   Social History Main Topics  . Smoking status: Former Smoker    Types: Cigarettes    Quit date: 06/27/1994  . Smokeless tobacco: Never Used     Comment: quit in 2002  . Alcohol Use: No  . Drug Use: No  . Sexual Activity: Yes   Other Topics Concern  . None   Social History Narrative   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 [place] Situation: lives with wife      Spiritual Beliefs: [employer]      Lifestyle: no regular exercise, diet is so so           Current outpatient prescriptions:  .  aspirin 81 MG tablet, Take 81 mg by mouth daily.  , Disp: , Rfl:  .  atorvastatin (LIPITOR) 40 MG tablet, Take 1 tablet (40 mg total) by mouth daily at 6 PM., Disp: 90 tablet, Rfl: 0 .  Avanafil (STENDRA) 100 MG TABS, Take 1 tablet by mouth as needed., Disp: 10 tablet, Rfl: 3 .  magnesium oxide-pyridoxine (BEELITH) 362-20 MG TABS, Take 1 tablet by mouth daily.  , Disp: , Rfl:   EXAM:  Filed Vitals:   08/26/15 0852  BP: 100/72  Pulse: 66  Temp: 97.8 F (36.6 C)    Body mass index is 30.44 kg/(m^2).  GENERAL: vitals reviewed and listed above, alert, oriented, appears well hydrated and in no acute distress  HEENT: atraumatic, conjunttiva clear, no obvious abnormalities on inspection of external nose and ears  NECK: no obvious masses on inspection  LUNGS: clear to auscultation bilaterally, no wheezes, rales or rhonchi, good air movement  CV: HRRR, no peripheral edema, normal pedal pulses, normal cap refill in feet  SKIN: dry scaly skin on feet - mild  MS: moves all extremities without noticeable abnormality - normal gait, normal strength and sensation in LE bilaterally  PSYCH/NEURO: pleasant and cooperative, no obvious depression or anxiety, speech and thought processing grossly intact, normal sensation, muscle strength, pinprick in bilat LE, normal gait, MMSE normal  ASSESSMENT  AND PLAN:  Discussed the following assessment and plan: > 40 minutes spent in caring for this pt today with > 50% face to face in counseling.  Hyperlipemia - Plan: Lipid Panel -continue medication, check FASTING lipid, lifestyle recs  Paresthesia - Plan: Basic metabolic panel, TSH, Methylmalonic Acid, Serum -query pruritis from mild foot fungus -labs per orders -offered neuro eval if persists  Erectile dysfunction, unspecified erectile dysfunction type -refilled medication per his request, he is aware of risks  Obesity (BMI 30-39.9) -lifestyle recs  Hyperglycemia - Plan: Hemoglobin A1c -he is concerned for diabetes with wt gain and foot issues  Vaccine counseling -on [facility] had +varicella titer in the past -discussed risks/benefits shingles vaccine, he opted to hold off on this  Memory issues: -pt denies, wife was concerned with this -normal recall of events and history on exam, normal affect, normal MMSE -offered further evaluation, he declined - he reports nobody else has concerns with his memory and he is very careful with this given hx dementia in his mother  Establishing care with new doctor -We reviewed the PMH, PSH, FH, SH, Meds and Allergies. -We provided refills for any medications we will prescribe as needed. -We addressed current concerns per orders and patient instructions. -We have asked for records for pertinent exams, studies, vaccines and notes from previous providers. -We have advised patient to follow up per instructions below.   -Patient advised to return or notify a doctor immediately if symptoms worsen or persist or new concerns arise.  Patient Instructions  BEFORE YOU LEAVE: -pneumococcal vaccine -schedule Medicare Wellness Visit/follow up in 4 months -labs  Use antifungal cream of feet daily for 1 month - follow up if persistent foot issues  Please get your colonoscopy  Please let us know if you want any further memory testing  -We have ordered  labs or studies at this visit. It can take up to 1-2 weeks for results and processing. We will contact you with instructions IF your results are abnormal. Normal results will be released to your [facility]. If you have not heard from [place] or can not find your results in [facility] in 2 weeks please contact our office.  We recommend the following healthy lifestyle measures: - eat a healthy whole foods diet consisting of regular small meals composed of vegetables, fruits, beans, nuts, seeds, healthy meats such as white chicken and fish and whole grains.  - avoid sweets, white starchy foods, fried foods, fast food, processed foods, sodas, red meet and other fattening foods.  - get a least 150-300 minutes of aerobic exercise per week.               Colin Benton R.

## 2015-08-26 NOTE — Patient Instructions (Signed)
BEFORE YOU LEAVE: -pneumococcal vaccine -schedule Medicare Wellness Visit/follow up in 4 months -labs  Use antifungal cream of feet daily for 1 month - follow up if persistent foot issues  Please get your colonoscopy  Please let us know if you want any further memory testing  -We have ordered labs or studies at this visit. It can take up to 1-2 weeks for results and processing. We will contact you with instructions IF your results are abnormal. Normal results will be released to your [facility]. If you have not heard from [place] or can not find your results in [facility] in 2 weeks please contact our office.  We recommend the following healthy lifestyle measures: - eat a healthy whole foods diet consisting of regular small meals composed of vegetables, fruits, beans, nuts, seeds, healthy meats such as white chicken and fish and whole grains.  - avoid sweets, white starchy foods, fried foods, fast food, processed foods, sodas, red meet and other fattening foods.  - get a least 150-300 minutes of aerobic exercise per week.

## 2015-08-26 NOTE — Addendum Note (Signed)
Addended by: Agnes Lawrence on: 08/26/2015 09:58 AM   Modules accepted: Orders

## 2015-08-29 LAB — METHYLMALONIC ACID, SERUM: Methylmalonic Acid, Quant: 108 nmol/L (ref 87–318)

## 2015-09-15 DIAGNOSIS — H40003 Preglaucoma, unspecified, bilateral: Secondary | ICD-10-CM | POA: Diagnosis not present

## 2015-09-22 DIAGNOSIS — D485 Neoplasm of uncertain behavior of skin: Secondary | ICD-10-CM | POA: Diagnosis not present

## 2015-09-23 DIAGNOSIS — C44729 Squamous cell carcinoma of skin of left lower limb, including hip: Secondary | ICD-10-CM | POA: Diagnosis not present

## 2015-09-23 DIAGNOSIS — L821 Other seborrheic keratosis: Secondary | ICD-10-CM | POA: Diagnosis not present

## 2015-10-01 DIAGNOSIS — C44729 Squamous cell carcinoma of skin of left lower limb, including hip: Secondary | ICD-10-CM | POA: Diagnosis not present

## 2015-10-02 DIAGNOSIS — C44729 Squamous cell carcinoma of skin of left lower limb, including hip: Secondary | ICD-10-CM | POA: Diagnosis not present

## 2015-10-10 ENCOUNTER — Telehealth: Payer: Self-pay | Admitting: Family Medicine

## 2015-10-10 MED ORDER — ATORVASTATIN CALCIUM 40 MG PO TABS: 40.0000 mg | ORAL_TABLET | Freq: Every day | ORAL | Status: DC

## 2015-10-10 NOTE — Telephone Encounter (Signed)
Pt request refill of the following: atorvastatin (LIPITOR) 40 MG tablet  90 day supply   Phamacy: [facility]

## 2015-10-30 DIAGNOSIS — Z85828 Personal history of other malignant neoplasm of skin: Secondary | ICD-10-CM | POA: Diagnosis not present

## 2015-10-30 DIAGNOSIS — D224 Melanocytic nevi of scalp and neck: Secondary | ICD-10-CM | POA: Diagnosis not present

## 2015-10-30 DIAGNOSIS — L111 Transient acantholytic dermatosis [Grover]: Secondary | ICD-10-CM | POA: Diagnosis not present

## 2015-10-30 DIAGNOSIS — L821 Other seborrheic keratosis: Secondary | ICD-10-CM | POA: Diagnosis not present

## 2015-10-30 DIAGNOSIS — D485 Neoplasm of uncertain behavior of skin: Secondary | ICD-10-CM | POA: Diagnosis not present

## 2015-10-30 DIAGNOSIS — L570 Actinic keratosis: Secondary | ICD-10-CM | POA: Diagnosis not present

## 2015-10-30 DIAGNOSIS — Z08 Encounter for follow-up examination after completed treatment for malignant neoplasm: Secondary | ICD-10-CM | POA: Diagnosis not present

## 2015-10-30 DIAGNOSIS — D3701 Neoplasm of uncertain behavior of lip: Secondary | ICD-10-CM | POA: Diagnosis not present

## 2015-10-30 DIAGNOSIS — X32XXXA Exposure to sunlight, initial encounter: Secondary | ICD-10-CM | POA: Diagnosis not present

## 2015-11-03 DIAGNOSIS — D2222 Melanocytic nevi of left ear and external auricular canal: Secondary | ICD-10-CM | POA: Diagnosis not present

## 2015-12-08 ENCOUNTER — Ambulatory Visit: Payer: Medicare Other | Admitting: Family Medicine

## 2015-12-08 ENCOUNTER — Ambulatory Visit (INDEPENDENT_AMBULATORY_CARE_PROVIDER_SITE_OTHER): Payer: Medicare Other | Admitting: Family Medicine

## 2015-12-08 ENCOUNTER — Encounter: Payer: Self-pay | Admitting: Family Medicine

## 2015-12-08 VITALS — BP 118/90 | HR 69 | Temp 98.6°F | Ht 73.25 in | Wt 224.3 lb

## 2015-12-08 DIAGNOSIS — Z1159 Encounter for screening for other viral diseases: Secondary | ICD-10-CM | POA: Diagnosis not present

## 2015-12-08 DIAGNOSIS — R739 Hyperglycemia, unspecified: Secondary | ICD-10-CM | POA: Diagnosis not present

## 2015-12-08 DIAGNOSIS — E669 Obesity, unspecified: Secondary | ICD-10-CM

## 2015-12-08 DIAGNOSIS — E785 Hyperlipidemia, unspecified: Secondary | ICD-10-CM

## 2015-12-08 DIAGNOSIS — N529 Male erectile dysfunction, unspecified: Secondary | ICD-10-CM | POA: Diagnosis not present

## 2015-12-08 DIAGNOSIS — Z0000 Encounter for general adult medical examination without abnormal findings: Secondary | ICD-10-CM | POA: Diagnosis not present

## 2015-12-08 LAB — LIPID PANEL
Cholesterol: 135 mg/dL (ref 0–200)
HDL: 28.8 mg/dL — ABNORMAL LOW (ref >39.00)
LDL Cholesterol: 85 mg/dL (ref 0–99)
NonHDL: 106.24
Total CHOL/HDL Ratio: 5
Triglycerides: 106 mg/dL (ref 0.0–149.0)
VLDL: 21.2 mg/dL (ref 0.0–40.0)

## 2015-12-08 LAB — HEMOGLOBIN A1C: Hgb A1c MFr Bld: 6.5 % (ref 4.6–6.5)

## 2015-12-08 MED ORDER — ATORVASTATIN CALCIUM 40 MG PO TABS: 40.0000 mg | ORAL_TABLET | Freq: Every day | ORAL | Status: DC

## 2015-12-08 NOTE — Progress Notes (Signed)
Medicare Annual Preventive Care Visit  (initial annual wellness or annual wellness exam)  HPI:  Has the following chronic problems that require follow up and concerns today:  Hyperlipidemia/Obesity/Hyperglycemia: -meds: asa, 53m lipitor -Lifestyle: Walking 30-45 minutes 3 days per week; trying to eat less sweets, soda  Occasional prickly sensation in plantar toes: -chronic in the past, thought possibly fungus, no complaints today  Erectile Dysfunction: -meds: stendra 1038mprn from prior PCP - reports works well but is very expensive and [place] to [place] -hx testosterone deficiency but did not respond to testosterone gel with prior PCP  Hx nephrolithiasis: -saw urologist in the past and takes mag for this  ROS: negative for report of fevers, unintentional weight loss, vision changes, vision loss, hearing loss or change, chest pain, sob, hemoptysis, melena, hematochezia, hematuria, genital discharge or lesions, falls, bleeding or bruising, loc, thoughts of suicide or self harm, memory loss  1.) Patient-completed health risk assessment  - completed and reviewed, see scanned documentation  2.) Review of Medical History: -PMH, PSH, Family History and current specialty and care providers reviewed and updated and listed below  - see scanned in document in chart and below  Past Medical History  Diagnosis Date  . Hyperlipidemia   . Colon polyps   . Diverticulosis   . 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 Kidney stones   . Erectile dysfunction   . Hypogonadism male     failed testosterone tx per prior PCP notes  . HERPES ZOSTER 10/03/2009    Qualifier: Diagnosis of  By: JeArnoldo MoraleD, JoBalinda Quails   Past Surgical History  Procedure Laterality Date  . Polypectomy    . Colonoscopy    . Vasectomy    . Wisdom tooth extraction      Social History   Social History  . Marital Status: Married    Spouse Name: N/A  . Number of Children: N/A  .  Years of Education: N/A   Occupational History  . Not on file.   Social History Main Topics  . Smoking status: Former Smoker    Types: Cigarettes    Quit date: 06/27/1994  . Smokeless tobacco: Never Used     Comment: quit in 2002  . Alcohol Use: No  . Drug Use: No  . Sexual Activity: Yes   Other Topics Concern  . Not on file   Social History Narrative   Work or School: works part time as [place] [place]: lives with wife      Spiritual Beliefs: Baptist      Lifestyle: no regular exercise, diet is so so          Family History  Problem Relation Age of Onset  . Retinal detachment    . Hyperlipidemia Mother   . Heart disease Father   . Stroke Father [id number]. Dementia Mother     Current Outpatient Prescriptions on File Prior to Visit  Medication Sig Dispense Refill  . aspirin 81 MG tablet Take 81 mg by mouth daily.      . magnesium oxide-pyridoxine (BEELITH) 362-20 MG TABS Take 1 tablet by mouth daily.       No current facility-administered medications on file prior to visit.     3.) Review of functional ability and level of safety:  Any difficulty hearing?  NO  History of falling?  NO  Any trouble with IADLs - using a phone, using transportation, grocery shopping, preparing meals, doing housework, doing laundry, taking medications and managing money? NO  Advance Directives? Yes, agrees to bring copy  See summary of recommendations in Patient Instructions below.  4.) Physical Exam Filed Vitals:   12/08/15 0817  BP: 118/90  Pulse: 69  Temp: 98.6 F (37 C)   Estimated body mass index is 29.38 kg/(m^2) as calculated from the following:   Height as of this encounter: 6' 1.25" (1.861 m).   Weight as of this encounter: 224 lb 4.8 oz (101.742 kg).  EKG (optional): deferred  General: alert, appear well hydrated and in no acute distress  HEENT: visual acuity grossly intact  CV: HRRR  Lungs: CTA  bilaterally  Psych: pleasant and cooperative, no obvious depression or anxiety  Cognitive function grossly intact  See patient instructions for recommendations.  Education and counseling regarding the above review of health provided with a plan for the following: -see scanned patient completed form for further details -fall prevention strategies discussed  -healthy lifestyle discussed -importance and resources for completing advanced directives discussed -see patient instructions below for any other recommendations provided  4)The following written screening schedule of preventive measures were reviewed with assessment and plan made per below, orders and patient instructions:      Alcohol screening done     Obesity Screening and counseling done     STI screening (Hep C if born [birth year]) offered and per pt wishes     Tobacco Screening done        Pneumococcal (PPSV23 -one dose after 64, one before if risk factors), influenza yearly and hepatitis B vaccines (if high risk - end stage renal disease, IV drugs, homosexual men, live in home for mentally retarded, hemophilia receiving factors) ASSESSMENT/PLAN: done       Prostate cancer screening ASSESSMENT/PLAN: discussed risks/benefits, done in 2014 with PSA, he opted against further testing for now      Colorectal cancer screening (FOBT yearly or flex sig q4y or colonoscopy q10y or barium enema q4y) ASSESSMENT/PLAN: done 2013 - repeat advised in 3 years due to multiple polyps, he admits he received a letter and agrees to call to schedule, stressed importance of rescheduling this and advised that he call today to set this up      Diabetes outpatient self-management training services ASSESSMENT/PLAN: utd or done      Screening for glaucoma(q1y if high risk - diabetes, FH, AA and > 50 or hispanic and > 65) ASSESSMENT/PLAN: utd or advised      Medical nutritional therapy for individuals with diabetes or renal disease ASSESSMENT/PLAN: see  orders      Cardiovascular screening blood tests (lipids q5y) ASSESSMENT/PLAN: see orders and labs      Diabetes screening tests ASSESSMENT/PLAN: see orders and labs   7.) Summary: -risk factors and conditions per above assessment were discussed and treatment, recommendations and referrals were offered per documentation above and orders and patient instructions.  Medicare annual wellness visit, subsequent  Hyperlipemia - Plan: Lipid panel, CMP with eGFR  Obesity (BMI 30-39.9)  Erectile dysfunction, unspecified erectile dysfunction type -gave written prescription to try sildenafil from [facility]  Need for hepatitis C screening test - Plan: Hep C Antibody  Hyperglycemia - Plan: Hemoglobin A1c  Patient Instructions  Before you leave: -Schedule follow-up in 4-6 months -Labs  Please call today to schedule your colonoscopy.   A copy of your advance directives to our office manager.  Please  check on the cost of the shingles vaccine and let us know if you wish to do this or if you get elsewhere.  We recommend the following healthy lifestyle measures: - eat a healthy whole foods diet consisting of regular small meals composed of vegetables, fruits, beans, nuts, seeds, healthy meats such as white chicken and fish and whole grains.  - avoid sweets, white starchy foods, fried foods, fast food, processed foods, sodas, red meet and other fattening foods.  - get a least 150-300 minutes of aerobic exercise per week.    We have ordered labs or studies at this visit. It can take up to 1-2 weeks for results and processing. IF results require follow up or explanation, we will call you with instructions. Clinically stable results will be released to your [facility]. If you have not heard from [place] or cannot find your results in [facility] in 2 weeks please contact our office at [phone].  If you are not yet signed up for [facility], please consider signing up.

## 2015-12-08 NOTE — Progress Notes (Signed)
Pre visit review using our clinic review tool, if applicable. No additional management support is needed unless otherwise documented below in the visit note. 

## 2015-12-08 NOTE — Patient Instructions (Addendum)
Before you leave: -Schedule follow-up in 4-6 months -Labs  Please call today to schedule your colonoscopy.   A copy of your advance directives to our office manager.  Please check on the cost of the shingles vaccine and let us know if you wish to do this or if you get elsewhere.  We recommend the following healthy lifestyle measures: - eat a healthy whole foods diet consisting of regular small meals composed of vegetables, fruits, beans, nuts, seeds, healthy meats such as white chicken and fish and whole grains.  - avoid sweets, white starchy foods, fried foods, fast food, processed foods, sodas, red meet and other fattening foods.  - get a least 150-300 minutes of aerobic exercise per week.    We have ordered labs or studies at this visit. It can take up to 1-2 weeks for results and processing. IF results require follow up or explanation, we will call you with instructions. Clinically stable results will be released to your [facility]. If you have not heard from [place] or cannot find your results in [facility] in 2 weeks please contact our office at [phone].  If you are not yet signed up for [facility], please consider signing up.

## 2015-12-09 LAB — COMPLETE METABOLIC PANEL WITH GFR
ALT: 10 U/L (ref 9–46)
AST: 18 U/L (ref 10–35)
Albumin: 4 g/dL (ref 3.6–5.1)
Alkaline Phosphatase: 80 U/L (ref 40–115)
BUN: 16 mg/dL (ref 7–25)
CO2: 26 mmol/L (ref 20–31)
Calcium: 8.9 mg/dL (ref 8.6–10.3)
Chloride: 105 mmol/L (ref 98–110)
Creat: 0.92 mg/dL (ref 0.70–1.25)
GFR, Est African American: >89 mL/min (ref >=60)
GFR, Est Non African American: 86 mL/min (ref >=60)
Glucose, Bld: 116 mg/dL — ABNORMAL HIGH (ref 65–99)
Potassium: 4.5 mmol/L (ref 3.5–5.3)
Sodium: 139 mmol/L (ref 135–146)
Total Bilirubin: 0.8 mg/dL (ref 0.2–1.2)
Total Protein: 6.9 g/dL (ref 6.1–8.1)

## 2015-12-09 LAB — HEPATITIS C ANTIBODY: HCV Ab: NEGATIVE

## 2016-04-07 NOTE — Progress Notes (Signed)
HPI:  Follow up:  Hyperlipidemia/Obesity/Hyperglycemia: -meds: asa, 40mg  lipitor -Lifestyle: Walking 30-45 minutes 3 days per week; since last visit cut out sodas and sweets and decreased 2nds - lost > 20 lbs and feeling great -knees feel better -he wants to recheck  Erectile Dysfunction: -meds: stendra 100mg  prn from prior PCP - reports works well but is very expensive and wants to try viagra from [place] -hx testosterone deficiency but did not respond to testosterone gel with prior PCP  Hx nephrolithiasis: -saw urologist in the past and takes mag oxide-pyridocine for this   ROS: See pertinent positives and negatives per HPI.  Past Medical History:  Diagnosis Date  . Colon polyps   . Diverticulosis   . Erectile dysfunction   . HERPES ZOSTER 10/03/2009   Qualifier: Diagnosis of  By: Arnoldo Morale MD, Balinda Quails   . Hyperlipidemia   . Hypogonadism male    failed testosterone tx per prior PCP notes  . Kidney stones   . 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 COLONOSCOPY    . POLYPECTOMY    . VASECTOMY    . WISDOM TOOTH EXTRACTION      Family History  Problem Relation Age of Onset  . Retinal detachment    . Hyperlipidemia Mother   . Heart disease Father   . Stroke Father 22  . Dementia Mother     Social History   Social History  . Marital status: Married    Spouse name: N/A  . Number of children: N/A  . Years of education: N/A   Social History Main Topics  . Smoking status: Former Smoker    Types: Cigarettes    Quit date: 06/27/1994  . Smokeless tobacco: Never Used     Comment: quit in 2002  . Alcohol use No  . Drug use: No  . Sexual activity: Yes   Other Topics Concern  . 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  aspirin 81 MG tablet, Take 81 mg by mouth daily.  , Disp: , Rfl:  .  atorvastatin (LIPITOR) 40 MG tablet, Take 1 tablet (40 mg total) by mouth daily at 6 PM., Disp: 90 tablet, Rfl: 3 .  magnesium oxide-pyridoxine (BEELITH) 362-20 MG TABS, Take 1 tablet by mouth daily.  , Disp: , Rfl:  .  sildenafil (REVATIO) 20 MG tablet, 2-5 tabs prn for sexual activity. No more then one dose in 24 hours., Disp: 50 tablet, Rfl: 0  EXAM:  Vitals:   04/08/16 0807  BP: 100/78  Pulse: 73  Temp: 98 F (36.7 C)    Body mass index is 27.52 kg/m.  GENERAL: vitals reviewed and listed above, alert, oriented, appears well hydrated and in no acute distress  HEENT: atraumatic, conjunttiva clear, no obvious abnormalities on inspection of external nose and ears  NECK: no obvious masses on inspection  LUNGS: clear to auscultation bilaterally, no wheezes, rales or rhonchi, good air movement  CV: HRRR, no peripheral edema  MS: moves all extremities without noticeable abnormality  PSYCH: pleasant and cooperative, no obvious  depression or anxiety  ASSESSMENT AND PLAN:  Discussed the following assessment and plan:  Hyperlipemia - Plan: Lipid Panel  Hyperglycemia - Plan: Hemoglobin A1c  Obesity -resolved!  History of colonic polyps -recheck hgba1c and lipids per his request -congratulated and supported on lifestyle recs and encourage to stick with these for LIFE -flu and shingles vaccines offered -advised again of importance of following up with his GI doc about repeat colonoscopy - he had agreed to do this after last visit - reports wife is doing this today! -Patient advised to return or notify a doctor immediately if symptoms worsen or persist or new concerns arise.  Patient Instructions  BEFORE YOU LEAVE: -flu shot, shingles vaccine -follow up: 4-6 months -labs  Congratulations on the lifestyle changes!  Please call  your gastroenterologist for follow up today.  We have ordered labs or studies at this visit. It can take up to 1-2 weeks for results and processing. IF results require follow up or explanation, we will call you with instructions. Clinically stable results will be released to your [facility]. If you have not heard from [place] or cannot find your results in [facility] in 2 weeks please contact our office at [phone].  If you are not yet signed up for [facility], please consider signing up.   We recommend the following healthy lifestyle for LIFE: 1) Small portions.   Tip: eat off of a salad plate instead of a dinner plate.  Tip: It is ok to feel hungry after a meal - that likely means you ate an appropriate portion.  Tip: if you need more or a snack choose fruits, veggies and/or a handful of nuts or seeds.  2) Eat a healthy clean diet.  * Tip: Avoid (less then 1 serving per week): processed foods, sweets, sweetened drinks, white starches (rice, flour, bread, potatoes, pasta, etc), red meat, fast foods, butter  *Tip: CHOOSE instead   * 5-9 servings per day of fresh or frozen fruits and vegetables (but not corn, potatoes, bananas, canned or dried fruit)   *nuts and seeds, beans   *olives and olive oil   *small portions of lean meats such as fish and white chicken    *small portions of whole grains  3)Get at least 150 minutes of sweaty aerobic exercise per week.  4)Reduce stress - consider counseling, meditation and relaxation to balance other aspects of your life.            Colin Benton R., DO

## 2016-04-08 ENCOUNTER — Ambulatory Visit (INDEPENDENT_AMBULATORY_CARE_PROVIDER_SITE_OTHER): Payer: Medicare Other | Admitting: Family Medicine

## 2016-04-08 ENCOUNTER — Encounter: Payer: Self-pay | Admitting: Internal Medicine

## 2016-04-08 ENCOUNTER — Encounter: Payer: Self-pay | Admitting: Family Medicine

## 2016-04-08 VITALS — BP 100/78 | HR 73 | Temp 98.0°F | Ht 73.25 in | Wt 210.0 lb

## 2016-04-08 DIAGNOSIS — R739 Hyperglycemia, unspecified: Secondary | ICD-10-CM | POA: Diagnosis not present

## 2016-04-08 DIAGNOSIS — Z860100 Personal history of colon polyps, unspecified: Secondary | ICD-10-CM

## 2016-04-08 DIAGNOSIS — E669 Obesity, unspecified: Secondary | ICD-10-CM | POA: Diagnosis not present

## 2016-04-08 DIAGNOSIS — Z23 Encounter for immunization: Secondary | ICD-10-CM | POA: Diagnosis not present

## 2016-04-08 DIAGNOSIS — E785 Hyperlipidemia, unspecified: Secondary | ICD-10-CM

## 2016-04-08 LAB — LIPID PANEL
Cholesterol: 130 mg/dL (ref 0–200)
HDL: 36.5 mg/dL — ABNORMAL LOW (ref >39.00)
LDL Cholesterol: 79 mg/dL (ref 0–99)
NonHDL: 93.58
Total CHOL/HDL Ratio: 4
Triglycerides: 74 mg/dL (ref 0.0–149.0)
VLDL: 14.8 mg/dL (ref 0.0–40.0)

## 2016-04-08 LAB — HEMOGLOBIN A1C: Hgb A1c MFr Bld: 6.1 % (ref 4.6–6.5)

## 2016-04-08 NOTE — Patient Instructions (Signed)
BEFORE YOU LEAVE: -flu shot, shingles vaccine -follow up: 4-6 months -labs  Congratulations on the lifestyle changes!  Please call your gastroenterologist for follow up today.  We have ordered labs or studies at this visit. It can take up to 1-2 weeks for results and processing. IF results require follow up or explanation, we will call you with instructions. Clinically stable results will be released to your [facility]. If you have not heard from [place] or cannot find your results in [facility] in 2 weeks please contact our office at [phone].  If you are not yet signed up for [facility], please consider signing up.   We recommend the following healthy lifestyle for LIFE: 1) Small portions.   Tip: eat off of a salad plate instead of a dinner plate.  Tip: It is ok to feel hungry after a meal - that likely means you ate an appropriate portion.  Tip: if you need more or a snack choose fruits, veggies and/or a handful of nuts or seeds.  2) Eat a healthy clean diet.  * Tip: Avoid (less then 1 serving per week): processed foods, sweets, sweetened drinks, white starches (rice, flour, bread, potatoes, pasta, etc), red meat, fast foods, butter  *Tip: CHOOSE instead   * 5-9 servings per day of fresh or frozen fruits and vegetables (but not corn, potatoes, bananas, canned or dried fruit)   *nuts and seeds, beans   *olives and olive oil   *small portions of lean meats such as fish and white chicken    *small portions of whole grains  3)Get at least 150 minutes of sweaty aerobic exercise per week.  4)Reduce stress - consider counseling, meditation and relaxation to balance other aspects of your life.

## 2016-04-08 NOTE — Progress Notes (Signed)
Pre visit review using our clinic review tool, if applicable. No additional management support is needed unless otherwise documented below in the visit note. 

## 2016-05-18 ENCOUNTER — Telehealth: Payer: Self-pay | Admitting: Family Medicine

## 2016-05-18 NOTE — Telephone Encounter (Signed)
SPOKE TO PATIENT AND HE DOESN'T UNDERSTAND WHY HE NEEDS TO DO AN AWV; DECLINES MAKING AN APPT  05-18-16  SHAY

## 2016-05-21 DIAGNOSIS — L821 Other seborrheic keratosis: Secondary | ICD-10-CM | POA: Diagnosis not present

## 2016-05-21 DIAGNOSIS — R58 Hemorrhage, not elsewhere classified: Secondary | ICD-10-CM | POA: Diagnosis not present

## 2016-05-21 DIAGNOSIS — L538 Other specified erythematous conditions: Secondary | ICD-10-CM | POA: Diagnosis not present

## 2016-05-21 DIAGNOSIS — L820 Inflamed seborrheic keratosis: Secondary | ICD-10-CM | POA: Diagnosis not present

## 2016-05-21 DIAGNOSIS — S0080XA Unspecified superficial injury of other part of head, initial encounter: Secondary | ICD-10-CM | POA: Diagnosis not present

## 2016-05-21 DIAGNOSIS — D224 Melanocytic nevi of scalp and neck: Secondary | ICD-10-CM | POA: Diagnosis not present

## 2016-05-21 DIAGNOSIS — Z08 Encounter for follow-up examination after completed treatment for malignant neoplasm: Secondary | ICD-10-CM | POA: Diagnosis not present

## 2016-05-21 DIAGNOSIS — Z85828 Personal history of other malignant neoplasm of skin: Secondary | ICD-10-CM | POA: Diagnosis not present

## 2016-05-21 DIAGNOSIS — L570 Actinic keratosis: Secondary | ICD-10-CM | POA: Diagnosis not present

## 2016-05-21 DIAGNOSIS — X32XXXA Exposure to sunlight, initial encounter: Secondary | ICD-10-CM | POA: Diagnosis not present

## 2016-05-21 DIAGNOSIS — D485 Neoplasm of uncertain behavior of skin: Secondary | ICD-10-CM | POA: Diagnosis not present

## 2016-05-24 DIAGNOSIS — L821 Other seborrheic keratosis: Secondary | ICD-10-CM | POA: Diagnosis not present

## 2016-05-31 ENCOUNTER — Ambulatory Visit (AMBULATORY_SURGERY_CENTER): Payer: Self-pay | Admitting: *Deleted

## 2016-05-31 VITALS — Ht 75.0 in | Wt 211.0 lb

## 2016-05-31 DIAGNOSIS — Z86010 Personal history of colonic polyps: Secondary | ICD-10-CM

## 2016-05-31 MED ORDER — NA SULFATE-K SULFATE-MG SULF 17.5-3.13-1.6 GM/177ML PO SOLN: 1.0000 | Freq: Once | ORAL | 0 refills | Status: AC

## 2016-05-31 NOTE — Progress Notes (Signed)
No egg or soy allergy known to patient  No issues with past sedation with any surgeries  or procedures, no intubation problems  No diet pills per patient No home 02 use per patient  No blood thinners per patient  Pt denies issues with constipation  No A fib or A flutter   

## 2016-06-06 ENCOUNTER — Encounter: Payer: Self-pay | Admitting: Family Medicine

## 2016-06-06 NOTE — Progress Notes (Deleted)
HPI:  Follow up:  Hyperlipidemia/Obesity/Hyperglycemia: -meds: asa, 40mg  lipitor -Lifestyle: Walking 30-45 minutes 3 days per week; since last visit cut out sodas and sweets and decreased 2nds - lost > 20 lbs and feeling great -knees feel better -he wants to recheck  Erectile Dysfunction: -meds: stendra 100mg  prn from prior PCP - reports works well but is very expensive and wants to try viagra from [place] -hx testosterone deficiency but did not respond to testosterone gel with prior PCP  Hx nephrolithiasis: -saw urologist in the past and takes mag oxide-pyridocine for this  ROS: See pertinent positives and negatives per HPI.  Past Medical History:  Diagnosis Date  . Colon polyps   . Diverticulosis   . Erectile dysfunction   . HERPES ZOSTER 10/03/2009   Qualifier: Diagnosis of  By: Arnoldo Morale MD, Balinda Quails   . Hyperlipidemia   . Hypogonadism male    failed testosterone tx per prior PCP notes  . Kidney stones   . 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 COLONOSCOPY    . POLYPECTOMY    . VASECTOMY    . WISDOM TOOTH EXTRACTION      Family History  Problem Relation Age of Onset  . Hyperlipidemia Mother   . Dementia Mother   . Heart disease Father   . Stroke Father 42  . Retinal detachment    . Colon cancer Neg Hx   . Colon polyps Neg Hx   . Rectal cancer Neg Hx   . Stomach cancer Neg Hx     Social History   Social History  . Marital status: Married    Spouse name: N/A  . Number of children: N/A  . Years of education: N/A   Social History Main Topics  . Smoking status: Former Smoker    Types: Cigarettes    Quit date: 06/27/1994  . Smokeless tobacco: Never Used     Comment: quit in 2002  . Alcohol use No  . Drug use: No  . Sexual activity: Yes   Other Topics Concern  . 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  aspirin 81 MG tablet, Take 81 mg by mouth daily.  , Disp: , Rfl:  .  atorvastatin (LIPITOR) 40 MG tablet, Take 1 tablet (40 mg total) by mouth daily at 6 PM., Disp: 90 tablet, Rfl: 3 .  magnesium oxide-pyridoxine (BEELITH) 362-20 MG TABS, Take 1 tablet by mouth daily.  , Disp: , Rfl:  .  sildenafil (REVATIO) 20 MG tablet, 2-5 tabs prn for sexual activity. No more then one dose in 24 hours., Disp: 50 tablet, Rfl: 0  EXAM:  There were no vitals filed for this visit.  There is no height or weight on file to calculate BMI.  GENERAL: vitals reviewed and listed above, alert, oriented, appears well hydrated and in no acute distress  HEENT: atraumatic, conjunttiva clear, no obvious abnormalities on inspection of external nose and ears  NECK: no obvious masses on inspection  LUNGS: clear to auscultation bilaterally, no wheezes, rales or rhonchi, good  air movement  CV: HRRR, no peripheral edema  MS: moves all extremities without noticeable abnormality  PSYCH: pleasant and cooperative, no obvious depression or anxiety  ASSESSMENT AND PLAN:  Discussed the following assessment and plan:  No diagnosis found.  -Patient advised to return or notify a doctor immediately if symptoms worsen or persist or new concerns arise.  There are no Patient Instructions on file for this visit.  Colin Benton R., DO

## 2016-06-07 ENCOUNTER — Ambulatory Visit: Payer: Medicare Other | Admitting: Family Medicine

## 2016-06-09 ENCOUNTER — Ambulatory Visit (AMBULATORY_SURGERY_CENTER): Payer: Medicare Other | Admitting: Internal Medicine

## 2016-06-09 ENCOUNTER — Encounter: Payer: Self-pay | Admitting: Internal Medicine

## 2016-06-09 VITALS — BP 115/75 | HR 57 | Temp 96.9°F | Resp 12 | Ht 75.0 in | Wt 211.0 lb

## 2016-06-09 DIAGNOSIS — Z86010 Personal history of colonic polyps: Secondary | ICD-10-CM

## 2016-06-09 DIAGNOSIS — D123 Benign neoplasm of transverse colon: Secondary | ICD-10-CM | POA: Diagnosis not present

## 2016-06-09 DIAGNOSIS — D125 Benign neoplasm of sigmoid colon: Secondary | ICD-10-CM

## 2016-06-09 DIAGNOSIS — K635 Polyp of colon: Secondary | ICD-10-CM

## 2016-06-09 DIAGNOSIS — Z1211 Encounter for screening for malignant neoplasm of colon: Secondary | ICD-10-CM | POA: Diagnosis not present

## 2016-06-09 MED ORDER — SODIUM CHLORIDE 0.9 % IV SOLN: 500.0000 mL | INTRAVENOUS | Status: AC

## 2016-06-09 NOTE — Progress Notes (Signed)
A and O x3. Report to RN. Tolerated MAC anesthesia well. 

## 2016-06-09 NOTE — Op Note (Signed)
[facility] Patient Name: Ariyon Bruney Procedure Date: 06/09/2016 8:07 AM MRN: [id number] Endoscopist: Docia Chuck. Henrene Pastor , MD Age: 65 Referring MD:  Date of Birth: 12-23-1950 Gender: Male Account #: [id number] Procedure:                Colonoscopy w/ cold snare x 2 Indications:              High risk colon cancer surveillance: Personal                            history of multiple (3 or more) adenomas. Prior                            exams 2007, 2013 Medicines:                Monitored Anesthesia Care Procedure:                Pre-Anesthesia Assessment:                           - Prior to the procedure, a History and Physical                            was performed, and patient medications and                            allergies were reviewed. The patient's tolerance of                            previous anesthesia was also reviewed. The risks                            and benefits of the procedure and the sedation                            options and risks were discussed with the patient.                            All questions were answered, and informed consent                            was obtained. Prior Anticoagulants: The patient has                            taken no previous anticoagulant or antiplatelet                            agents. ASA Grade Assessment: II - A patient with                            mild systemic disease. After reviewing the risks                            and benefits, the patient was deemed in  satisfactory condition to undergo the procedure.                           After obtaining informed consent, the colonoscope                            was passed under direct vision. Throughout the                            procedure, the patient's blood pressure, pulse, and                            oxygen saturations were monitored continuously. The                            Model CF-HQ190L [id number]) scope  was introduced                            through the anus and advanced to the the cecum,                            identified by appendiceal orifice and ileocecal                            valve. The ileocecal valve, appendiceal orifice,                            and rectum were photographed. The quality of the                            bowel preparation was good. The colonoscopy was                            performed without difficulty. The patient tolerated                            the procedure well. The bowel preparation used was                            SUPREP. Scope In: 8:16:27 AM Scope Out: 8:34:05 AM Scope Withdrawal Time: 0 hours 13 minutes 4 seconds  Total Procedure Duration: 0 hours 17 minutes 38 seconds  Findings:                 Two polyps were found in the sigmoid colon and                            transverse colon. The polyps were 2 mm in size.                            These polyps were removed with a cold snare.                            Resection and retrieval were complete.  Multiple small and large-mouthed diverticula were                            found in the left colon.                           Internal hemorrhoids were found during retroflexion.                           The exam was otherwise without abnormality on                            direct and retroflexion views. Complications:            No immediate complications. Estimated blood loss:                            None. Estimated Blood Loss:     Estimated blood loss: none. Impression:               - Two 2 mm polyps in the sigmoid colon and in the                            transverse colon, removed with a cold snare.                            Resected and retrieved.                           - Diverticulosis in the left colon.                           - Internal hemorrhoids.                           - The examination was otherwise normal on direct                             and retroflexion views. Recommendation:           - Repeat colonoscopy in 5 years for surveillance.                           - Patient has a contact number available for                            emergencies. The signs and symptoms of potential                            delayed complications were discussed with the                            patient. Return to normal activities tomorrow.                            Written discharge instructions were provided to the  patient.                           - Resume previous diet.                           - Continue present medications.                           - Await pathology results. Docia Chuck. Henrene Pastor, MD 06/09/2016 8:42:09 AM This report has been signed electronically.

## 2016-06-09 NOTE — Patient Instructions (Signed)
Impression/Recommendations:  Polyp handout given to patient. Diverticulosis handout given to patient. Hemorrhoid handout given to patient.  Repeat colonoscopy in 5 years for surveillance.  YOU HAD AN ENDOSCOPIC PROCEDURE TODAY AT [facility]:   Refer to the procedure report that was given to you for any specific questions about what was found during the examination.  If the procedure report does not answer your questions, please call your gastroenterologist to clarify.  If you requested that your care partner not be given the details of your procedure findings, then the procedure report has been included in a sealed envelope for you to review at your convenience later.  YOU SHOULD EXPECT: Some feelings of bloating in the abdomen. Passage of more gas than usual.  Walking can help get rid of the air that was put into your GI tract during the procedure and reduce the bloating. If you had a lower endoscopy (such as a colonoscopy or flexible sigmoidoscopy) you may notice spotting of blood in your stool or on the toilet paper. If you underwent a bowel prep for your procedure, you may not have a normal bowel movement for a few days.  Please Note:  You might notice some irritation and congestion in your nose or some drainage.  This is from the oxygen used during your procedure.  There is no need for concern and it should clear up in a day or so.  SYMPTOMS TO REPORT IMMEDIATELY:   Following lower endoscopy (colonoscopy or flexible sigmoidoscopy):  Excessive amounts of blood in the stool  Significant tenderness or worsening of abdominal pains  Swelling of the abdomen that is new, acute  Fever of 100F or higher  For urgent or emergent issues, a gastroenterologist can be reached at any hour by calling [phone].   DIET:  We do recommend a small meal at first, but then you may proceed to your regular diet.  Drink plenty of fluids but you should avoid alcoholic beverages for 24  hours.  ACTIVITY:  You should plan to take it easy for the rest of today and you should NOT DRIVE or use heavy machinery until tomorrow (because of the sedation medicines used during the test).    FOLLOW UP: Our staff will call the number listed on your records the next business day following your procedure to check on you and address any questions or concerns that you may have regarding the information given to you following your procedure. If we do not reach you, we will leave a message.  However, if you are feeling well and you are not experiencing any problems, there is no need to return our call.  We will assume that you have returned to your regular daily activities without incident.  If any biopsies were taken you will be contacted by phone or by letter within the next 1-3 weeks.  Please call us at [phone] if you have not heard about the biopsies in 3 weeks.    SIGNATURES/CONFIDENTIALITY: You and/or your care partner have signed paperwork which will be entered into your electronic medical record.  These signatures attest to the fact that that the information above on your After Visit Summary has been reviewed and is understood.  Full responsibility of the confidentiality of this discharge information lies with you and/or your care-partner.

## 2016-06-09 NOTE — Progress Notes (Signed)
Called to room to assist during endoscopic procedure.  Patient ID and intended procedure confirmed with present staff. Received instructions for my participation in the procedure from the performing physician.  

## 2016-06-10 ENCOUNTER — Telehealth: Payer: Self-pay | Admitting: *Deleted

## 2016-06-10 NOTE — Telephone Encounter (Signed)
No answer. Name identifier. Message left to call if questions or concerns. 

## 2016-06-11 ENCOUNTER — Telehealth: Payer: Self-pay

## 2016-06-11 NOTE — Telephone Encounter (Signed)
  Follow up Call-  Call back number 06/09/2016  Post procedure Call Back phone  # [phone]  Permission to leave phone message Yes  Some recent data might be hidden     Patient questions:  Do you have a fever, pain , or abdominal swelling? No. Pain Score  0 *  Have you tolerated food without any problems? Yes.    Have you been able to return to your normal activities? Yes.    Do you have any questions about your discharge instructions: Diet   No. Medications  No. Follow up visit  No.  Do you have questions or concerns about your Care? No.  Actions: * If pain score is 4 or above: No action needed, pain <4.

## 2016-06-14 ENCOUNTER — Encounter: Payer: Self-pay | Admitting: Internal Medicine

## 2016-07-15 DIAGNOSIS — R109 Unspecified abdominal pain: Secondary | ICD-10-CM | POA: Diagnosis not present

## 2016-08-02 DIAGNOSIS — N2889 Other specified disorders of kidney and ureter: Secondary | ICD-10-CM | POA: Diagnosis not present

## 2016-08-02 DIAGNOSIS — N289 Disorder of kidney and ureter, unspecified: Secondary | ICD-10-CM | POA: Diagnosis not present

## 2016-08-02 DIAGNOSIS — N23 Unspecified renal colic: Secondary | ICD-10-CM | POA: Diagnosis not present

## 2016-08-02 DIAGNOSIS — R109 Unspecified abdominal pain: Secondary | ICD-10-CM | POA: Diagnosis not present

## 2016-08-02 DIAGNOSIS — K5901 Slow transit constipation: Secondary | ICD-10-CM | POA: Diagnosis not present

## 2016-08-25 DIAGNOSIS — H25013 Cortical age-related cataract, bilateral: Secondary | ICD-10-CM | POA: Diagnosis not present

## 2016-08-25 DIAGNOSIS — H40003 Preglaucoma, unspecified, bilateral: Secondary | ICD-10-CM | POA: Diagnosis not present

## 2016-08-25 DIAGNOSIS — H5213 Myopia, bilateral: Secondary | ICD-10-CM | POA: Diagnosis not present

## 2016-10-03 NOTE — Progress Notes (Signed)
HPI:  David Carey is a pleasant 66 y.o. here for follow up. Chronic medical problems summarized below were reviewed for changes and stability and were updated as needed below. These issues and their treatment remain stable for the most part. Had made sig LS changes last visit with resulting wt reduction and improvement in labs. Denies CP, SOB, DOE, treatment intolerance or new symptoms. Due for medicare exam with labs in May.  Hyperlipidemia/Obesity/Hyperglycemia: -meds: asa, 40mg  lipitor -Lifestyle: Walking 30-45 minutes 3 days per week; since last visit cut out sodas and sweets and decreased 2nds - lost > 20 lbs and feeling great  Erectile Dysfunction: -meds: stendra 100mg  prn from prior PCP; then wanted to try viagra -hx testosterone deficiency but did not respond to testosterone gel with prior PCP  Hx nephrolithiasis: -saw urologist in the past and takes mag oxide-pyridocine for this  ROS: See pertinent positives and negatives per HPI.  Past Medical History:  Diagnosis Date  . Colon polyps   . Diverticulosis   . Erectile dysfunction   . HERPES ZOSTER 10/03/2009   Qualifier: Diagnosis of  By: Arnoldo Morale MD, Balinda Quails   . Hyperlipidemia   . Hypogonadism male    failed testosterone tx per prior PCP notes  . Kidney stones   . 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 COLONOSCOPY    . POLYPECTOMY    . VASECTOMY    . WISDOM TOOTH EXTRACTION      Family History  Problem Relation Age of Onset  . Hyperlipidemia Mother   . Dementia Mother   . Heart disease Father   . Stroke Father 29  . Retinal detachment    . Colon cancer Neg Hx   . Colon polyps Neg Hx   . Rectal cancer Neg Hx   . Stomach cancer Neg Hx     Social History   Social History  . Marital status: Married    Spouse name: N/A  . Number of children: N/A  . Years of education: N/A   Social History Main Topics  . Smoking status: Former  Smoker    Types: Cigarettes    Quit date: 06/27/1994  . Smokeless tobacco: Never Used     Comment: quit in 2002  . Alcohol use No  . Drug use: No  . Sexual activity: Yes   Other Topics Concern  . 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  aspirin 81 MG tablet, Take 81 mg by mouth daily.  , Disp: , Rfl:  .  atorvastatin (LIPITOR) 40 MG tablet, Take 1 tablet (40 mg total) by mouth daily at 6 PM., Disp: 90 tablet, Rfl: 3 .  magnesium oxide-pyridoxine (BEELITH) 362-20 MG TABS, Take 1 tablet by mouth daily.  , Disp: , Rfl:  .  Multiple Vitamin (MULTIVITAMIN) capsule, Take 1 capsule by mouth daily., Disp: , Rfl:  .  sildenafil (REVATIO) 20 MG tablet, 2-5 tabs prn for sexual activity. No more then one dose in 24 hours., Disp: 50 tablet, Rfl: 0  Current Facility-Administered Medications:  .  0.9 %  sodium chloride infusion, 500 mL, Intravenous, Continuous, Irene Shipper, MD  EXAM:  There were no vitals filed for this visit.  There is no height or weight on file to calculate BMI.  GENERAL: vitals reviewed and listed above, alert, oriented, appears well hydrated and in no acute distress  HEENT: atraumatic, conjunttiva clear, no obvious abnormalities on inspection of external nose and ears  NECK: no obvious masses on inspection  LUNGS: clear to auscultation bilaterally, no wheezes, rales or rhonchi, good air movement  CV: HRRR, no peripheral edema  MS: moves all extremities without noticeable abnormality  PSYCH: pleasant and cooperative, no obvious depression or anxiety  ASSESSMENT AND PLAN:  Discussed the following assessment and plan:  No diagnosis found.  -Patient advised to return or notify a doctor immediately if symptoms  worsen or persist or new concerns arise.  There are no Patient Instructions on file for this visit.  Colin Benton R., DO

## 2016-10-03 NOTE — Progress Notes (Signed)
HPI:  David Carey is a pleasant 66 y.o. here for follow up. Chronic medical problems summarized below were reviewed for changes and stability and were updated as needed below. These issues and their treatment remain stable for the most part. At last appointment he had made significant life. Reports he continues to exercise and eat healthy. Does have some foot discomfort at times. Bilat, mild discomfort, mainly at night. Does admit needs new shoes. Denies CP, SOB, DOE, treatment intolerance or new symptoms. Due for Medicare exam with labs in May.  Hyperlipidemia/Obesity/Hyperglycemia/OA knees: -meds: asa, 40mg  lipitor -Lifestyle: Walking 30-45 minutes 3 days per week; since last visit cut out sodas and sweets and decreased 2nds - lost > 20 lbs and feeling great  Erectile Dysfunction: -meds: stendra 100mg  prn from prior PCP; then wanted to try viagra -hx testosterone deficiency but did not respond to testosterone gel with prior PCP  Hx nephrolithiasis: -saw urologist in the past and takes mag oxide-pyridocine for this   ROS: See pertinent positives and negatives per HPI.  Past Medical History:  Diagnosis Date  . Colon polyps   . Diverticulosis   . Erectile dysfunction   . HERPES ZOSTER 10/03/2009   Qualifier: Diagnosis of  By: Arnoldo Morale MD, Balinda Quails   . Hyperlipidemia   . Hypogonadism male    failed testosterone tx per prior PCP notes  . Kidney stones   . 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 COLONOSCOPY    . POLYPECTOMY    . VASECTOMY    . WISDOM TOOTH EXTRACTION      Family History  Problem Relation Age of Onset  . Hyperlipidemia Mother   . Dementia Mother   . Heart disease Father   . Stroke Father 25  . Retinal detachment    . Colon cancer Neg Hx   . Colon polyps Neg Hx   . Rectal cancer Neg Hx   . Stomach cancer Neg Hx     Social History   Social History  . Marital status: Married     Spouse name: N/A  . Number of children: N/A  . Years of education: N/A   Social History Main Topics  . Smoking status: Former Smoker    Types: Cigarettes    Quit date: 06/27/1994  . Smokeless tobacco: Never Used     Comment: quit in 2002  . Alcohol use No  . Drug use: No  . Sexual activity: Yes   Other Topics Concern  . 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  aspirin 81 MG tablet, Take 81 mg by mouth daily.  , Disp: , Rfl:  .  atorvastatin (LIPITOR) 40 MG tablet, Take 1 tablet (40 mg total) by mouth daily at 6 PM., Disp: 90 tablet, Rfl: 3 .  magnesium oxide-pyridoxine (BEELITH) 362-20 MG TABS, Take 1 tablet by mouth daily.  , Disp: , Rfl:  .  Multiple Vitamin (MULTIVITAMIN) capsule, Take 1 capsule by mouth daily., Disp: , Rfl:  .  sildenafil (REVATIO) 20 MG tablet, 2-5 tabs prn for sexual activity.  No more then one dose in 24 hours., Disp: 50 tablet, Rfl: 0  Current Facility-Administered Medications:  .  0.9 %  sodium chloride infusion, 500 mL, Intravenous, Continuous, Irene Shipper, MD  EXAM:  Vitals:   10/04/16 0759  BP: 120/70  Pulse: 61  Temp: 98.3 F (36.8 C)    Body mass index is 27.62 kg/m.  GENERAL: vitals reviewed and listed above, alert, oriented, appears well hydrated and in no acute distress  HEENT: atraumatic, conjunttiva clear, no obvious abnormalities on inspection of external nose and ears  NECK: no obvious masses on inspection  LUNGS: clear to auscultation bilaterally, no wheezes, rales or rhonchi, good air movement  CV: HRRR, no peripheral edema  MS: moves all extremities without noticeable abnormality, normal inspection feet, normal pedal pulses, normal cap refill, normal sensitivity to light touch  and monofilament testing, mild medial foot callus and   PSYCH: pleasant and cooperative, no obvious depression or anxiety  ASSESSMENT AND PLAN:  Discussed the following assessment and plan:  Hyperlipidemia, unspecified hyperlipidemia type  Erectile dysfunction, unspecified erectile dysfunction type  BMI 27.0-27.9,adult  Pain in both feet  -suspect mild compression cut nerve in feet from foot wear and he plans to try gait analysis and new shoes -cont healthy diet and regular exercise -labs at CPE - he wants CPE and wellness visit -Patient advised to return or notify a doctor immediately if symptoms worsen or persist or new concerns arise.  Patient Instructions  BEFORE YOU LEAVE: -follow up: Medicare exam with Manuela Schwartz and CPE in May per patient preference  Consider gait analysis and changing footwear.   We recommend the following healthy lifestyle for LIFE: 1) Small portions.   Tip: eat off of a salad plate instead of a dinner plate.  Tip: if you need more or a snack choose fruits, veggies and/or a handful of nuts or seeds.  2) Eat a healthy clean diet.  * Tip: Avoid (less then 1 serving per week): processed foods, sweets, sweetened drinks, white starches (rice, flour, bread, potatoes, pasta, etc), red meat, fast foods, butter  *Tip: CHOOSE instead   * 5-9 servings per day of fresh or frozen fruits and vegetables (but not corn, potatoes, bananas, canned or dried fruit)   *nuts and seeds, beans   *olives and olive oil   *small portions of lean meats such as fish and white chicken    *small portions of whole grains  3)Get at least 150 minutes of sweaty aerobic exercise per week.  4)Reduce stress - consider counseling, meditation and relaxation to balance other aspects of your life.    Colin Benton R., DO

## 2016-10-04 ENCOUNTER — Encounter: Payer: Self-pay | Admitting: Family Medicine

## 2016-10-04 ENCOUNTER — Ambulatory Visit (INDEPENDENT_AMBULATORY_CARE_PROVIDER_SITE_OTHER): Payer: Medicare Other | Admitting: Family Medicine

## 2016-10-04 VITALS — BP 120/70 | HR 61 | Temp 98.3°F | Ht 75.0 in | Wt 221.0 lb

## 2016-10-04 DIAGNOSIS — N529 Male erectile dysfunction, unspecified: Secondary | ICD-10-CM

## 2016-10-04 DIAGNOSIS — E785 Hyperlipidemia, unspecified: Secondary | ICD-10-CM

## 2016-10-04 DIAGNOSIS — M79671 Pain in right foot: Secondary | ICD-10-CM | POA: Diagnosis not present

## 2016-10-04 DIAGNOSIS — Z6827 Body mass index (BMI) 27.0-27.9, adult: Secondary | ICD-10-CM

## 2016-10-04 DIAGNOSIS — M79672 Pain in left foot: Secondary | ICD-10-CM

## 2016-10-04 NOTE — Progress Notes (Signed)
Pre visit review using our clinic review tool, if applicable. No additional management support is needed unless otherwise documented below in the visit note. 

## 2016-10-04 NOTE — Patient Instructions (Signed)
BEFORE YOU LEAVE: -follow up: Medicare exam with Manuela Schwartz and CPE in May per patient preference  Consider gait analysis and changing footwear.   We recommend the following healthy lifestyle for LIFE: 1) Small portions.   Tip: eat off of a salad plate instead of a dinner plate.  Tip: if you need more or a snack choose fruits, veggies and/or a handful of nuts or seeds.  2) Eat a healthy clean diet.  * Tip: Avoid (less then 1 serving per week): processed foods, sweets, sweetened drinks, white starches (rice, flour, bread, potatoes, pasta, etc), red meat, fast foods, butter  *Tip: CHOOSE instead   * 5-9 servings per day of fresh or frozen fruits and vegetables (but not corn, potatoes, bananas, canned or dried fruit)   *nuts and seeds, beans   *olives and olive oil   *small portions of lean meats such as fish and white chicken    *small portions of whole grains  3)Get at least 150 minutes of sweaty aerobic exercise per week.  4)Reduce stress - consider counseling, meditation and relaxation to balance other aspects of your life.

## 2016-10-13 DIAGNOSIS — H40003 Preglaucoma, unspecified, bilateral: Secondary | ICD-10-CM | POA: Diagnosis not present

## 2016-11-02 DIAGNOSIS — D485 Neoplasm of uncertain behavior of skin: Secondary | ICD-10-CM | POA: Diagnosis not present

## 2016-11-03 DIAGNOSIS — D2362 Other benign neoplasm of skin of left upper limb, including shoulder: Secondary | ICD-10-CM | POA: Diagnosis not present

## 2016-11-22 DIAGNOSIS — Z08 Encounter for follow-up examination after completed treatment for malignant neoplasm: Secondary | ICD-10-CM | POA: Diagnosis not present

## 2016-11-22 DIAGNOSIS — D224 Melanocytic nevi of scalp and neck: Secondary | ICD-10-CM | POA: Diagnosis not present

## 2016-11-22 DIAGNOSIS — L570 Actinic keratosis: Secondary | ICD-10-CM | POA: Diagnosis not present

## 2016-11-22 DIAGNOSIS — Z85828 Personal history of other malignant neoplasm of skin: Secondary | ICD-10-CM | POA: Diagnosis not present

## 2016-11-22 DIAGNOSIS — L821 Other seborrheic keratosis: Secondary | ICD-10-CM | POA: Diagnosis not present

## 2016-11-22 DIAGNOSIS — X32XXXA Exposure to sunlight, initial encounter: Secondary | ICD-10-CM | POA: Diagnosis not present

## 2016-11-22 DIAGNOSIS — L111 Transient acantholytic dermatosis [Grover]: Secondary | ICD-10-CM | POA: Diagnosis not present

## 2016-12-16 ENCOUNTER — Other Ambulatory Visit: Payer: Self-pay | Admitting: Family Medicine

## 2016-12-22 NOTE — Progress Notes (Signed)
HPI:   Here for AWV And follow up - seeing susan for AWV - see her notes for preventive care. Due for labs: hgba1c? Lipid? - looked ok last visit after sig lifestyle changes, PSA? He wants PSA - understands risks. Declines DRE.  -Concerns and/or follow up today:   Foot discomfort: -bilat ant foot pads chronically -reports feels like a very mild tingling pain -not really bothersome, just noticeable -does not want to pursue eval or meds at this time -denies weakness, numbness, trauma -changing footwear does not seem to help  Hyperlipidemia/Hx of Obesity/Hyperglycemia: -meds: asa, 40mg  lipitor -Lifestyle: changed to healthier lifestyle and lost and has maintained weight - no longer obese!  Erectile Dysfunction: -meds: stendra 100mg  prn from prior PCP; then wanted to try viagra -hx testosterone deficiency but did not respond to testosterone gel with prior PCP  Hx nephrolithiasis: -saw urologist in the past and takes mag oxide-pyridocinefor this  ROS: See pertinent positives and negatives per HPI.  Past Medical History:  Diagnosis Date  . Colon polyps   . Diverticulosis   . Erectile dysfunction   . HERPES ZOSTER 10/03/2009   Qualifier: Diagnosis of  By: Arnoldo Morale MD, Balinda Quails   . Hyperlipidemia   . Hypogonadism male    failed testosterone tx per prior PCP notes  . Kidney stones   . 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 COLONOSCOPY    . POLYPECTOMY    . VASECTOMY    . WISDOM TOOTH EXTRACTION      Family History  Problem Relation Age of Onset  . Hyperlipidemia Mother   . Dementia Mother   . Heart disease Father   . Stroke Father 36  . Retinal detachment Unknown   . Colon cancer Neg Hx   . Colon polyps Neg Hx   . Rectal cancer Neg Hx   . Stomach cancer Neg Hx     Social History   Social History  . Marital status: Married    Spouse name: N/A  . Number of children: N/A  . Years of  education: N/A   Social History Main Topics  . Smoking status: Former Smoker    Types: Cigarettes    Quit date: 06/27/1994  . Smokeless tobacco: Never Used     Comment: quit in 2002  . Alcohol use No  . Drug use: No  . Sexual activity: Yes   Other Topics Concern  . None   Social History Narrative   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 [place] Situation: lives with wife      Spiritual Beliefs: [employer]      Lifestyle: no regular exercise, diet is so so           Current Outpatient Prescriptions:  .  aspirin 81 MG tablet, Take 81 mg by mouth daily.  , Disp: , Rfl:  .  atorvastatin (LIPITOR) 40 MG tablet, TAKE 1 TABLET (40 MG TOTAL) BY MOUTH DAILY AT 6 PM., Disp: 90 tablet, Rfl: 1 .  magnesium oxide-pyridoxine (BEELITH) 362-20 MG TABS, Take 1 tablet by mouth daily.  , Disp: , Rfl:  .  Multiple Vitamin (MULTIVITAMIN) capsule, Take 1 capsule by mouth daily., Disp: , Rfl:  .  sildenafil (REVATIO) 20 MG tablet, 2-5 tabs prn for sexual activity. No more then one dose in 24 hours., Disp: 50 tablet, Rfl:  0  Current Facility-Administered Medications:  .  0.9 %  sodium chloride infusion, 500 mL, Intravenous, Continuous, Irene Shipper, MD  EXAM:  Vitals:   12/23/16 0722  BP: 100/70  Pulse: 62  Temp: 97.8 F (36.6 C)    Body mass index is 28.43 kg/m.  GENERAL: vitals reviewed and listed above, alert, oriented, appears well hydrated and in no acute distress  HEENT: atraumatic, conjunttiva clear, no obvious abnormalities on inspection of external nose and ears  NECK: no obvious masses on inspection  FEET: normal inspection, normal pedal pulses, normal sensitivity and cap refill  LUNGS: clear to auscultation bilaterally, no wheezes, rales or rhonchi, good air movement  CV: HRRR, no peripheral edema  ABD: soft, NTTP  DRE: declined  MS: moves all extremities without noticeable abnormality, see above for foot exam, normal  gait  PSYCH: pleasant and cooperative, no obvious depression or anxiety  ASSESSMENT AND PLAN:  Discussed the following assessment and plan:  Medicare annual wellness visit, subsequent -sees separate note  Foot pain, bilateral Paresthesia - Plan: CBC -discussed potential etiologies and potential eval and treatment -he declines further eval, NCS or treatment -did check b12 and cbc in the past  Hyperlipidemia, unspecified hyperlipidemia type - Plan: Lipid panel -cont statin, lifestyle recs  Hyperglycemia - Plan: Hemoglobin A1c  BMI 28.0-28.9,adult -so happy for him that he has maintained healthier wt, lifestyle recs  Screening for prostate cancer -he wishes to do, will have Manuela Schwartz review since medicare flag not paid for and order with waiver if he still wishes to proceed  Erectile dysfunction, unspecified erectile dysfunction type -stable  -Patient advised to return or notify a doctor immediately if symptoms worsen or persist or new concerns arise.  Patient Instructions  BEFORE YOU LEAVE: -follow up: 4 months -labs  We have ordered labs or studies at this visit. It can take up to 1-2 weeks for results and processing. IF results require follow up or explanation, we will call you with instructions. Clinically stable results will be released to your [facility]. If you have not heard from [place] or cannot find your results in [facility] in 2 weeks please contact our office at [phone].  If you are not yet signed up for [facility], please consider signing up.   We recommend the following healthy lifestyle for LIFE: 1) Small portions.   Tip: eat off of a salad plate instead of a dinner plate.  Tip: It is ok to feel hungry after a meal - that likely means you ate an appropriate portion.  Tip: if you need more or a snack choose fruits, veggies and/or a handful of nuts or seeds.  2) Eat a healthy clean diet.  * Tip: Avoid (less then 1 serving per week): processed foods, sweets, sweetened  drinks, white starches (rice, flour, bread, potatoes, pasta, etc), red meat, fast foods, butter  *Tip: CHOOSE instead   * 5-9 servings per day of fresh or frozen fruits and vegetables (but not corn, potatoes, bananas, canned or dried fruit)   *nuts and seeds, beans   *olives and olive oil   *small portions of lean meats such as fish and white chicken    *small portions of whole grains  3)Get at least 150 minutes of sweaty aerobic exercise per week.  4)Reduce stress - consider counseling, meditation and relaxation to balance other aspects of your life.    Colin Benton R., DO

## 2016-12-22 NOTE — Progress Notes (Signed)
Subjective:   David Carey is a 66 y.o. male who presents for Medicare Annual/Subsequent preventive examination.  The Patient was informed that the wellness visit is to identify future health risk and educate and initiate measures that can reduce risk for increased disease through the lifespan.    NO ROS; Medicare Wellness Visit Hx Squamous cell carcinoma  Describes health as good, fair or great? Good   Preventive Screening -Counseling & Management  PSA 1.55 Colonoscopy 05/2016; repeat 05/2021  Dr Henrene Pastor; confirm next colonoscopy 2021;   Smoking history - Former smoker Quit 37'  Will defer AAA screen as he has had abd/pelvis CT with the last one 07/2010   Smokeless tobacco no Second Hand Smoke status; No Smokers in the home ETOH - no  Medication adherence or issues? 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 Works in the yard  Cardiac Risk Factors:  Advanced aged > 45 in men;  Hyperlipidemia - chol 130; HDL 36; LDL 79; trig 74 Diabetes - A1c 6.1; BS 116 2017 - Brought his pre- DM back in control by limiting sugar  Family History - Hyperlipidemia; dementia; HD and stroke Father had open heart surgery but lived to 63;  Mother dementia; last onset  Obesity neg  Fall risk  Given education on "Fall Prevention in the Home" for more safety tips the patient can apply as appropriate.    Mobility of Functional changes this year?no   Mental Health:  Any emotional problems? Anxious, depressed, irritable, sad or blue? no Denies feeling depressed or hopeless; voices pleasure in daily life How many social activities have you been engaged in within the last 2 weeks? no  Hearing Screening Comments: States no issues  Vision Screening Comments: Vision checks; every year Dr. Verl Blalock, in  [place]    Activities of Daily Living - See functional screen   Cognitive testing; Ad8 score; 0 or less than 2  MMSE deferred or completed if AD8 + 2 issues  Advanced Directives completed   Patient Care Team: [name], DO as PCP - General (Family Medicine)   Immunization History  Administered Date(s) Administered  . Influenza Split 07/24/2012  . Influenza Whole 04/18/2009  . Influenza, High Dose Seasonal PF 05/28/2014, 04/08/2016  . Influenza,inj,Quad PF,36+ Mos 06/20/2013  . Influenza-Unspecified 06/26/2015  . Pneumococcal Conjugate-13 02/04/2014  . Pneumococcal Polysaccharide-23 10/03/2009, 08/26/2015  . Td 02/23/1998, 08/28/2007   Required Immunizations needed today  Screening test up to date or reviewed for plan of completion There are no preventive care reminders to display for this patient.  Cardiac Risk Factors include: advanced age (>62men, >99 women);dyslipidemia;family history of premature cardiovascular disease;male gender     Objective:    Vitals: Ht 6\' 3"  (1.905 m)   Wt 215 lb (97.5 kg)   BMI 26.87 kg/m   Body mass index is 26.87 kg/m.  Tobacco History  Smoking Status  . Former Smoker  . Types: Cigarettes  . Quit date: 06/27/1994  Smokeless Tobacco  . Never Used    Comment: quit in 2002 - 14 yo      Counseling given: Yes   Past Medical History:  Diagnosis Date  . Colon polyps   . Diverticulosis   . Erectile dysfunction   . HERPES ZOSTER 10/03/2009  Qualifier: Diagnosis of  By: Arnoldo Morale MD, Balinda Quails   . Hyperlipidemia   . Hypogonadism male    failed testosterone tx per prior PCP notes  . Kidney stones   . 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 COLONOSCOPY    . POLYPECTOMY    . VASECTOMY    . WISDOM TOOTH EXTRACTION     Family History  Problem Relation Age of Onset  . Hyperlipidemia Mother   . Dementia Mother   . Heart disease Father   . Stroke Father 59  .  Retinal detachment Unknown   . Colon cancer Neg Hx   . Colon polyps Neg Hx   . Rectal cancer Neg Hx   . Stomach cancer Neg Hx    History  Sexual Activity  . Sexual activity: Yes    Outpatient Encounter Prescriptions as of 12/23/2016  Medication Sig  . aspirin 81 MG tablet Take 81 mg by mouth daily.    Marland Kitchen atorvastatin (LIPITOR) 40 MG tablet TAKE 1 TABLET (40 MG TOTAL) BY MOUTH DAILY AT 6 PM.  . magnesium oxide-pyridoxine (BEELITH) 362-20 MG TABS Take 1 tablet by mouth daily.    . Multiple Vitamin (MULTIVITAMIN) capsule Take 1 capsule by mouth daily.  . sildenafil (REVATIO) 20 MG tablet 2-5 tabs prn for sexual activity. No more then one dose in 24 hours.   Facility-Administered Encounter Medications as of 12/23/2016  Medication  . 0.9 %  sodium chloride infusion    Activities of Daily Living In your present state of health, do you have any difficulty performing the following activities: 12/23/2016  Hearing? N  Vision? N  Difficulty concentrating or making decisions? N  Walking or climbing stairs? N  Dressing or bathing? N  Doing errands, shopping? N  Preparing Food and eating ? N  Using the Toilet? N  In the past six months, have you accidently leaked urine? N  Do you have problems with loss of bowel control? N  Managing your Medications? N  Managing your Finances? N  Housekeeping or managing your Housekeeping? N  Some recent data might be hidden    Patient Care Team: [name], DO as PCP - General (Family Medicine)   Assessment:     Exercise Activities and Dietary recommendations Current Exercise Habits: Home exercise routine, Type of exercise: walking, Time (Minutes): 60, Frequency (Times/Week): 5, Weekly Exercise (Minutes/Week): 300, Intensity: Moderate  Goals    . patient          To each goal weight of 200lb  Losing 7 to 8 % of the body weight will reduce your A1c       Fall Risk Fall Risk  12/23/2016 12/23/2016 12/08/2015 08/26/2015  Falls in the past year?  No No No No   Depression Screen PHQ 2/9 Scores 12/23/2016 12/23/2016 12/08/2015 08/26/2015  PHQ - 2 Score 0 0 0 0    Cognitive Function MMSE - Mini Mental State Exam 12/23/2016  Not completed: (No Data)        Immunization History  Administered Date(s) Administered  . Influenza Split 07/24/2012  . Influenza Whole 04/18/2009  . Influenza, High Dose Seasonal PF 05/28/2014, 04/08/2016  . Influenza,inj,Quad PF,36+ Mos 06/20/2013  . Influenza-Unspecified 06/26/2015  . Pneumococcal Conjugate-13 02/04/2014  . Pneumococcal Polysaccharide-23 10/03/2009, 08/26/2015  . Td 02/23/1998, 08/28/2007   Screening Tests Health Maintenance  Topic Date Due  . INFLUENZA VACCINE  02/23/2017  . COLONOSCOPY  06/09/2021  . TETANUS/TDAP  07/03/2022  . Hepatitis C Screening  Completed  . PNA vac Low Risk Adult  Completed      Plan:    PCP Notes  Health Maintenance Screens up to date   Waived AAA screen due to multiple CTs of the abd/ pelvis [name] to advise if he should proceed with the VAS AAA   Abnormal Screens None  Referrals none  Patient concerns; none, Is in good shape; no issues   Nurse Concerns; none  Next PCP apt TBS    I have personally reviewed and noted the following in the patient's chart:   . Medical and social history . Use of alcohol, tobacco or illicit drugs  . Current medications and supplements . Functional ability and status . Nutritional status . Physical activity . Advanced directives . List of other physicians . Hospitalizations, surgeries, and ER visits in previous 12 months . Vitals . Screenings to include cognitive, depression, and falls . Referrals and appointments  In addition, I have reviewed and discussed with patient certain preventive protocols, quality metrics, and best practice recommendations. A written personalized care plan for preventive services as well as general preventive health recommendations were provided to patient.      Wynetta Fines, RN  12/23/2016

## 2016-12-23 ENCOUNTER — Ambulatory Visit (INDEPENDENT_AMBULATORY_CARE_PROVIDER_SITE_OTHER): Payer: Medicare Other | Admitting: Family Medicine

## 2016-12-23 ENCOUNTER — Encounter: Payer: Self-pay | Admitting: Family Medicine

## 2016-12-23 ENCOUNTER — Ambulatory Visit: Payer: Medicare Other

## 2016-12-23 VITALS — Ht 75.0 in | Wt 215.0 lb

## 2016-12-23 VITALS — BP 100/70 | HR 62 | Temp 97.8°F | Ht 73.0 in | Wt 215.5 lb

## 2016-12-23 DIAGNOSIS — R202 Paresthesia of skin: Secondary | ICD-10-CM

## 2016-12-23 DIAGNOSIS — Z0001 Encounter for general adult medical examination with abnormal findings: Secondary | ICD-10-CM | POA: Diagnosis not present

## 2016-12-23 DIAGNOSIS — Z125 Encounter for screening for malignant neoplasm of prostate: Secondary | ICD-10-CM | POA: Diagnosis not present

## 2016-12-23 DIAGNOSIS — M79671 Pain in right foot: Secondary | ICD-10-CM | POA: Diagnosis not present

## 2016-12-23 DIAGNOSIS — Z6828 Body mass index (BMI) 28.0-28.9, adult: Secondary | ICD-10-CM

## 2016-12-23 DIAGNOSIS — M79672 Pain in left foot: Secondary | ICD-10-CM | POA: Diagnosis not present

## 2016-12-23 DIAGNOSIS — Z0000 Encounter for general adult medical examination without abnormal findings: Secondary | ICD-10-CM

## 2016-12-23 DIAGNOSIS — R739 Hyperglycemia, unspecified: Secondary | ICD-10-CM

## 2016-12-23 DIAGNOSIS — E785 Hyperlipidemia, unspecified: Secondary | ICD-10-CM | POA: Diagnosis not present

## 2016-12-23 DIAGNOSIS — N529 Male erectile dysfunction, unspecified: Secondary | ICD-10-CM

## 2016-12-23 LAB — CBC
HCT: 43.1 % (ref 39.0–52.0)
Hemoglobin: 14.6 g/dL (ref 13.0–17.0)
MCHC: 33.8 g/dL (ref 30.0–36.0)
MCV: 97 fl (ref 78.0–100.0)
Platelets: 200 10*3/uL (ref 150.0–400.0)
RBC: 4.45 Mil/uL (ref 4.22–5.81)
RDW: 13.3 % (ref 11.5–15.5)
WBC: 5.8 10*3/uL (ref 4.0–10.5)

## 2016-12-23 LAB — LIPID PANEL
Cholesterol: 134 mg/dL (ref 0–200)
HDL: 41.1 mg/dL (ref >39.00)
LDL Cholesterol: 72 mg/dL (ref 0–99)
NonHDL: 92.92
Total CHOL/HDL Ratio: 3
Triglycerides: 106 mg/dL (ref 0.0–149.0)
VLDL: 21.2 mg/dL (ref 0.0–40.0)

## 2016-12-23 LAB — HEMOGLOBIN A1C: Hgb A1c MFr Bld: 6.4 % (ref 4.6–6.5)

## 2016-12-23 NOTE — Patient Instructions (Addendum)
[name] , Thank you for taking time to come for your Medicare Wellness Visit. I appreciate your ongoing commitment to your health goals. Please review the following plan we discussed and let me know if I can assist you in the future.   Manuela Schwartz to check with [name] regarding waiver of the Abd Aortic Aneurysm screen due to several abd CT.    These are the goals we discussed: Goals    . patient          To each goal weight of 200lb  Losing 7 to 8 % of the body weight will reduce your A1c        This is a list of the screening recommended for you and due dates:  Health Maintenance  Topic Date Due  . Flu Shot  02/23/2017  . Colon Cancer Screening  06/09/2021  . Tetanus Vaccine  07/03/2022  .  Hepatitis C: One time screening is recommended by Center for Disease Control  (CDC) for  adults born from [birth year] through 1967.   Completed  . Pneumonia vaccines  Completed   Educated regarding prediabetes and numbers;  A1c ranges from 5.8 to 6.5 or fasting Blood sugar > 115 -126; (126 is diabetic)   Risk: >45yo; family hx; overweight or obese; African American; Hispanic; Latino; American [place] American; [place]; history of diabetes when pregnant; or birth to a baby weighing over 9 lbs. Being less physically active than 30 minutes; 3 times a week;   Prevention; Losing a modest 7 to 8 lbs; If over 200 lbs; 10 to 14 lbs;  Choose healthier foods; colorful veggies; fish or lean meats; drinks water Reduce portion size Start exercising; 30 minutes of fast walking x 30 minutes per day/ 60 min for weight loss     Summary: Preventive Care for Adults  A healthy lifestyle and preventive care can promote health and wellness. Preventive health guidelines for adults include the following key practices.  . A routine yearly physical is a good way to check with your health care provider about your health and preventive screening. It is a chance to share any concerns and updates on your  health and to receive a thorough exam.  . Visit your dentist for a routine exam and preventive care every 6 months. Brush your teeth twice a day and floss once a day. Good oral hygiene prevents tooth decay and gum disease.  . The frequency of eye exams is based on your age, health, family medical history, use  of contact lenses, and other factors. Follow your health care provider's ecommendations for frequency of eye exams.  . Eat a healthy diet. Foods like vegetables, fruits, whole grains, low-fat dairy products, and lean protein foods contain the nutrients you need without too many calories. Decrease your intake of foods high in solid fats, added sugars, and salt. Eat the right amount of calories for you. Get information about a proper diet from your health care provider, if necessary.  . Regular physical exercise is one of the most important things you can do for your health. Most adults should get at least 150 minutes of moderate-intensity exercise (any activity that increases your heart rate and causes you to sweat) each week. In addition, most adults need muscle-strengthening exercises on 2 or more days a week.  Silver Sneakers may be a benefit available to you. To determine eligibility, you may visit the website: www.silversneakers.com or contact program at 220-559-2498 Mon-Fri between 8AM-8PM.   . Maintain  a healthy weight. The body mass index (BMI) is a screening tool to identify possible weight problems. It provides an estimate of body fat based on height and weight. Your health care provider can find your BMI and can help you achieve or maintain a healthy weight.   For adults 20 years and older: ? A BMI below 18.5 is considered underweight. ? A BMI of 18.5 to 24.9 is normal. ? A BMI of 27 to 28 is considered normal by the Institutes of Health  ? A BMI of 30 and above is considered obese.   . Maintain normal blood lipids and cholesterol levels by exercising and minimizing  your intake of saturated fat. Eat a balanced diet with plenty of fruit and vegetables. Blood tests for lipids and cholesterol should begin at age 11 and be repeated every 5 years. If your lipid or cholesterol levels are high, you are over 50, or you are at high risk for heart disease, you may need your cholesterol levels checked more frequently. Ongoing high lipid and cholesterol levels should be treated with medicines if diet and exercise are not working.  . If you smoke, find out from your health care provider how to quit. If you do not use tobacco, please do not start.  . If you choose to drink alcohol, please do not consume more than one drink for women and 2 for men.  One drink is considered to be 12 ounces (355 mL) of beer, 5 ounces (148 mL) of wine, or 1.5 ounces (44 mL) of liquor. Moderation of alcohol intake to this level decreases your risk of breast cancer and liver damage.   . If you are 11-38 years old, ask your health care provider if you should take aspirin to prevent strokes.  . Use sunscreen. Apply sunscreen liberally and repeatedly throughout the day. You should seek shade when your shadow is shorter than you. Protect yourself by wearing long sleeves, pants, a wide-brimmed hat, and sunglasses year round, whenever you are outdoors.  . Once a month, do a whole body skin exam, using a mirror to look at the skin on your back. Tell your health care provider of new moles, moles that have irregular borders, moles that are larger than a pencil eraser, or moles that have changed in shape or color.  Last, if you have completed an Advanced Directive; please bring a copy and review with your physician and then we will scan to the medical record     Fall Prevention in the Home Falls can cause injuries. They can happen to people of all ages. There are many things you can do to make your home safe and to help prevent falls. What can I do on the outside of my home?  Regularly fix the  edges of walkways and driveways and fix any cracks.  Remove anything that might make you trip as you walk through a door, such as a raised step or threshold.  Trim any bushes or trees on the path to your home.  Use bright outdoor lighting.  Clear any walking paths of anything that might make someone trip, such as rocks or tools.  Regularly check to see if handrails are loose or broken. Make sure that both sides of any steps have handrails.  Any raised decks and porches should have guardrails on the edges.  Have any leaves, snow, or ice cleared regularly.  Use sand or salt on walking paths during winter.  Clean up any spills in your  garage right away. This includes oil or grease spills. What can I do in the bathroom?  Use night lights.  Install grab bars by the toilet and in the tub and shower. Do not use towel bars as grab bars.  Use non-skid mats or decals in the tub or shower.  If you need to sit down in the shower, use a plastic, non-slip stool.  Keep the floor dry. Clean up any water that spills on the floor as soon as it happens.  Remove soap buildup in the tub or shower regularly.  Attach bath mats securely with double-sided non-slip rug tape.  Do not have throw rugs and other things on the floor that can make you trip. What can I do in the bedroom?  Use night lights.  Make sure that you have a light by your bed that is easy to reach.  Do not use any sheets or blankets that are too big for your bed. They should not hang down onto the floor.  Have a firm chair that has side arms. You can use this for support while you get dressed.  Do not have throw rugs and other things on the floor that can make you trip. What can I do in the kitchen?  Clean up any spills right away.  Avoid walking on wet floors.  Keep items that you use a lot in easy-to-reach places.  If you need to reach something above you, use a strong step stool that has a grab bar.  Keep  electrical cords out of the way.  Do not use floor polish or wax that makes floors slippery. If you must use wax, use non-skid floor wax.  Do not have throw rugs and other things on the floor that can make you trip. What can I do with my stairs?  Do not leave any items on the stairs.  Make sure that there are handrails on both sides of the stairs and use them. Fix handrails that are broken or loose. Make sure that handrails are as long as the stairways.  Check any carpeting to make sure that it is firmly attached to the stairs. Fix any carpet that is loose or worn.  Avoid having throw rugs at the top or bottom of the stairs. If you do have throw rugs, attach them to the floor with carpet tape.  Make sure that you have a light switch at the top of the stairs and the bottom of the stairs. If you do not have them, ask someone to add them for you. What else can I do to help prevent falls?  Wear shoes that: ? Do not have high heels. ? Have rubber bottoms. ? Are comfortable and fit you well. ? Are closed at the toe. Do not wear sandals.  If you use a stepladder: ? Make sure that it is fully opened. Do not climb a closed stepladder. ? Make sure that both sides of the stepladder are locked into place. ? Ask someone to hold it for you, if possible.  Clearly mark and make sure that you can see: ? Any grab bars or handrails. ? First and last steps. ? Where the edge of each step is.  Use tools that help you move around (mobility aids) if they are needed. These include: ? Canes. ? Walkers. ? Scooters. ? Crutches.  Turn on the lights when you go into a dark area. Replace any light bulbs as soon as they burn out.  Set up  your furniture so you have a clear path. Avoid moving your furniture around.  If any of your floors are uneven, fix them.  If there are any pets around you, be aware of where they are.  Review your medicines with your doctor. Some medicines can make you feel dizzy.  This can increase your chance of falling. Ask your doctor what other things that you can do to help prevent falls. This information is not intended to replace advice given to you by your health care provider. Make sure you discuss any questions you have with your health care provider. Document Released: 05/08/2009 Document Revised: 12/18/2015 Document Reviewed: 08/16/2014 Elsevier Interactive Patient Education  2018 [place] Maintenance, Male A healthy lifestyle and preventive care is important for your health and wellness. Ask your health care provider about what schedule of regular examinations is right for you. What should I know about weight and diet? Eat a Healthy Diet  Eat plenty of vegetables, fruits, whole grains, low-fat dairy products, and lean protein.  Do not eat a lot of foods high in solid fats, added sugars, or salt.  Maintain a Healthy Weight Regular exercise can help you achieve or maintain a healthy weight. You should:  Do at least 150 minutes of exercise each week. The exercise should increase your heart rate and make you sweat (moderate-intensity exercise).  Do strength-training exercises at least twice a week.  Watch Your Levels of Cholesterol and Blood Lipids  Have your blood tested for lipids and cholesterol every 5 years starting at 68 years of age. If you are at high risk for heart disease, you should start having your blood tested when you are 68 years old. You may need to have your cholesterol levels checked more often if: ? Your lipid or cholesterol levels are high. ? You are older than 68 years of age. ? You are at high risk for heart disease.  What should I know about cancer screening? Many types of cancers can be detected early and may often be prevented. Lung Cancer  You should be screened every year for lung cancer if: ? You are a current smoker who has smoked for at least 30 years. ? You are a former smoker who has quit within the  past 15 years.  Talk to your health care provider about your screening options, when you should start screening, and how often you should be screened.  Colorectal Cancer  Routine colorectal cancer screening usually begins at 68 years of age and should be repeated every 5-10 years until you are 68 years old. You may need to be screened more often if early forms of precancerous polyps or small growths are found. Your health care provider may recommend screening at an earlier age if you have risk factors for colon cancer.  Your health care provider may recommend using home test kits to check for hidden blood in the stool.  A small camera at the end of a tube can be used to examine your colon (sigmoidoscopy or colonoscopy). This checks for the earliest forms of colorectal cancer.  Prostate and Testicular Cancer  Depending on your age and overall health, your health care provider may do certain tests to screen for prostate and testicular cancer.  Talk to your health care provider about any symptoms or concerns you have about testicular or prostate cancer.  Skin Cancer  Check your skin from head to toe regularly.  Tell your health care provider about any new moles  or changes in moles, especially if: ? There is a change in a mole's size, shape, or color. ? You have a mole that is larger than a pencil eraser.  Always use sunscreen. Apply sunscreen liberally and repeat throughout the day.  Protect yourself by wearing long sleeves, pants, a wide-brimmed hat, and sunglasses when outside.  What should I know about heart disease, diabetes, and high blood pressure?  If you are 25-52 years of age, have your blood pressure checked every 3-5 years. If you are 25 years of age or older, have your blood pressure checked every year. You should have your blood pressure measured twice-once when you are at a hospital or clinic, and once when you are not at a hospital or clinic. Record the average of the two  measurements. To check your blood pressure when you are not at a hospital or clinic, you can use: ? An automated blood pressure machine at a pharmacy. ? A home blood pressure monitor.  Talk to your health care provider about your target blood pressure.  If you are between 40-110 years old, ask your health care provider if you should take aspirin to prevent heart disease.  Have regular diabetes screenings by checking your fasting blood sugar level. ? If you are at a normal weight and have a low risk for diabetes, have this test once every three years after the age of 56. ? If you are overweight and have a high risk for diabetes, consider being tested at a younger age or more often.  A one-time screening for abdominal aortic aneurysm (AAA) by ultrasound is recommended for men aged 65-75 years who are current or former smokers. What should I know about preventing infection? Hepatitis B If you have a higher risk for hepatitis B, you should be screened for this virus. Talk with your health care provider to find out if you are at risk for hepatitis B infection. Hepatitis C Blood testing is recommended for:  Everyone born from [birth year] through 1967.  Anyone with known risk factors for hepatitis C.  Sexually Transmitted Diseases (STDs)  You should be screened each year for STDs including gonorrhea and chlamydia if: ? You are sexually active and are younger than 68 years of age. ? You are older than 69 years of age and your health care provider tells you that you are at risk for this type of infection. ? Your sexual activity has changed since you were last screened and you are at an increased risk for chlamydia or gonorrhea. Ask your health care provider if you are at risk.  Talk with your health care provider about whether you are at high risk of being infected with HIV. Your health care provider may recommend a prescription medicine to help prevent HIV infection.  What else can I do?  Schedule  regular health, dental, and eye exams.  Stay current with your vaccines (immunizations).  Do not use any tobacco products, such as cigarettes, chewing tobacco, and e-cigarettes. If you need help quitting, ask your health care provider.  Limit alcohol intake to no more than 2 drinks per day. One drink equals 12 ounces of beer, 5 ounces of wine, or 1 ounces of hard liquor.  Do not use street drugs.  Do not share needles.  Ask your health care provider for help if you need support or information about quitting drugs.  Tell your health care provider if you often feel depressed.  Tell your health care provider if you have  ever been abused or do not feel safe at home. This information is not intended to replace advice given to you by your health care provider. Make sure you discuss any questions you have with your health care provider. Document Released: 01/08/2008 Document Revised: 03/10/2016 Document Reviewed: 04/15/2015 Elsevier Interactive Patient Education  2018 [employer]  

## 2016-12-23 NOTE — Patient Instructions (Signed)
BEFORE YOU LEAVE: -follow up: 4 months -labs  We have ordered labs or studies at this visit. It can take up to 1-2 weeks for results and processing. IF results require follow up or explanation, we will call you with instructions. Clinically stable results will be released to your [facility]. If you have not heard from [place] or cannot find your results in [facility] in 2 weeks please contact our office at [phone].  If you are not yet signed up for [facility], please consider signing up.   We recommend the following healthy lifestyle for LIFE: 1) Small portions.   Tip: eat off of a salad plate instead of a dinner plate.  Tip: It is ok to feel hungry after a meal - that likely means you ate an appropriate portion.  Tip: if you need more or a snack choose fruits, veggies and/or a handful of nuts or seeds.  2) Eat a healthy clean diet.  * Tip: Avoid (less then 1 serving per week): processed foods, sweets, sweetened drinks, white starches (rice, flour, bread, potatoes, pasta, etc), red meat, fast foods, butter  *Tip: CHOOSE instead   * 5-9 servings per day of fresh or frozen fruits and vegetables (but not corn, potatoes, bananas, canned or dried fruit)   *nuts and seeds, beans   *olives and olive oil   *small portions of lean meats such as fish and white chicken    *small portions of whole grains  3)Get at least 150 minutes of sweaty aerobic exercise per week.  4)Reduce stress - consider counseling, meditation and relaxation to balance other aspects of your life.

## 2016-12-23 NOTE — Progress Notes (Signed)
David Dais R., DO  

## 2017-03-02 ENCOUNTER — Telehealth: Payer: Self-pay

## 2017-03-02 NOTE — Telephone Encounter (Signed)
Called to David Carey Ask David Carey if he would like to have a PSA drawn. This was not drawn at his annual labs. The patient will be back in the office next week and will drop by and have it drawn at that time.  Dr. Kim/ apologize for late fup  sh

## 2017-03-02 NOTE — Telephone Encounter (Signed)
[place] he is coming to do it - looks like was ordered in may but not done. Thanks.

## 2017-04-14 ENCOUNTER — Encounter: Payer: Self-pay | Admitting: Family Medicine

## 2017-05-26 DIAGNOSIS — Z23 Encounter for immunization: Secondary | ICD-10-CM | POA: Diagnosis not present

## 2017-05-31 DIAGNOSIS — L738 Other specified follicular disorders: Secondary | ICD-10-CM | POA: Diagnosis not present

## 2017-05-31 DIAGNOSIS — L111 Transient acantholytic dermatosis [Grover]: Secondary | ICD-10-CM | POA: Diagnosis not present

## 2017-05-31 DIAGNOSIS — L821 Other seborrheic keratosis: Secondary | ICD-10-CM | POA: Diagnosis not present

## 2017-05-31 DIAGNOSIS — Z08 Encounter for follow-up examination after completed treatment for malignant neoplasm: Secondary | ICD-10-CM | POA: Diagnosis not present

## 2017-05-31 DIAGNOSIS — X32XXXA Exposure to sunlight, initial encounter: Secondary | ICD-10-CM | POA: Diagnosis not present

## 2017-05-31 DIAGNOSIS — L570 Actinic keratosis: Secondary | ICD-10-CM | POA: Diagnosis not present

## 2017-05-31 DIAGNOSIS — D224 Melanocytic nevi of scalp and neck: Secondary | ICD-10-CM | POA: Diagnosis not present

## 2017-05-31 DIAGNOSIS — Z85828 Personal history of other malignant neoplasm of skin: Secondary | ICD-10-CM | POA: Diagnosis not present

## 2017-06-06 ENCOUNTER — Other Ambulatory Visit: Payer: Self-pay | Admitting: Family Medicine

## 2017-08-04 ENCOUNTER — Encounter: Payer: Self-pay | Admitting: Family Medicine

## 2017-10-27 DIAGNOSIS — H40003 Preglaucoma, unspecified, bilateral: Secondary | ICD-10-CM | POA: Diagnosis not present

## 2017-10-27 DIAGNOSIS — H5213 Myopia, bilateral: Secondary | ICD-10-CM | POA: Diagnosis not present

## 2017-10-27 DIAGNOSIS — H25013 Cortical age-related cataract, bilateral: Secondary | ICD-10-CM | POA: Diagnosis not present

## 2017-10-27 LAB — HM DIABETES EYE EXAM

## 2017-11-03 DIAGNOSIS — H40003 Preglaucoma, unspecified, bilateral: Secondary | ICD-10-CM | POA: Diagnosis not present

## 2017-11-21 ENCOUNTER — Other Ambulatory Visit: Payer: Self-pay | Admitting: Family Medicine

## 2017-12-26 NOTE — Progress Notes (Signed)
Subjective:   David Carey is a 67 y.o. male who presents for Medicare Annual/Subsequent preventive examination.  Last AWV 513/2018 Reports health as good  Feet are tingling- considering referral for neuro  To discuss with Dr. Mellody Dance 11/2016 A1c 6.4 last year - needs apt with [name] for A1c check and lipids  Diet 29.3 BMI elevated; gained approx 10 lb since last year Lost weight initially by cutting out soda's and starting drinking sugar free Cut back on bread Eat clean; salads and chicken   Regular exercise  Goes for a walk 4 times a week Works on a machine 2 times a week at home. Works in the yard   There are no preventive care reminders to display for this patient.  AAA abd/pelvis CT 08/2010  Colonoscopy 05/2016  Due in 5 years  Cardiac Risk Factors include: advanced age (>73men, >49 women);family history of premature cardiovascular disease;male gender Educated shingrix  Thinks he was tested and was neg     Objective:    Vitals: BP 102/62   Pulse 61   Ht 6\' 3"  (1.905 m)   Wt 226 lb (102.5 kg)   SpO2 97%   BMI 28.25 kg/m   Body mass index is 28.25 kg/m.  Advanced Directives 12/27/2017 12/23/2016 06/09/2016 05/31/2016  Does Patient Have a Medical Advance Directive? Yes Yes Yes Yes  Type of Advance Directive - - [place];Living will [place];Living will    Tobacco Social History   Tobacco Use  Smoking Status Former Smoker  . Types: Cigarettes  . Last attempt to quit: 06/27/1994  . Years since quitting: 23.5  Smokeless Tobacco Never Used  Tobacco Comment   quit in 2002 - 14 yo      Counseling given: Yes Comment: quit in 2002 - 14 yo    Clinical Intake:    Past Medical History:  Diagnosis Date  . Colon polyps   . Diverticulosis   . Erectile dysfunction   . HERPES ZOSTER 10/03/2009   Qualifier: Diagnosis of  By: Arnoldo Morale MD, Balinda Quails   . Hyperlipidemia   . Hypogonadism male    failed testosterone tx per  prior PCP notes  . Kidney stones   . 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 COLONOSCOPY    . POLYPECTOMY    . VASECTOMY    . WISDOM TOOTH EXTRACTION     Family History  Problem Relation Age of Onset  . Hyperlipidemia Mother   . Dementia Mother   . Heart disease Father   . Stroke Father 106  . Retinal detachment Unknown   . Colon cancer Neg Hx   . Colon polyps Neg Hx   . Rectal cancer Neg Hx   . Stomach cancer Neg Hx    Social History   Socioeconomic History  . Marital status: Married    Spouse name: Not on file  . Number of children: Not on file  . Years of education: Not on file  . Highest education level: Not on file  Occupational History  . Not on file  Social Needs  . Financial resource strain: Not on file  . Food insecurity:    Worry: Not on file    Inability: Not on file  . Transportation needs:    Medical: Not on file    Non-medical: Not on file  Tobacco Use  .  Smoking status: Former Smoker    Types: Cigarettes    Last attempt to quit: 06/27/1994    Years since quitting: 23.5  . Smokeless tobacco: Never Used  . Tobacco comment: quit in 2002 - 14 yo   Substance and Sexual Activity  . Alcohol use: No  . Drug use: No  . Sexual activity: Yes  Lifestyle  . Physical activity:    Days per week: Not on file    Minutes per session: Not on file  . Stress: Not on file  Relationships  . Social connections:    Talks on phone: Not on file    Gets together: Not on file    Attends religious service: Not on file    Active member of club or organization: Not on file    Attends meetings of clubs or organizations: Not on file    Relationship status: Not on file  Other Topics Concern  . 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 aspirin 81 MG tablet Take 81 mg by mouth daily.    Marland Kitchen atorvastatin (LIPITOR) 40 MG tablet TAKE ONE TABLET BY MOUTH ONE TIME DAILY AT 6PM  . magnesium oxide-pyridoxine (BEELITH) 362-20 MG TABS Take 1 tablet by mouth daily.    . Multiple Vitamin (MULTIVITAMIN) capsule Take 1 capsule by mouth daily.  . Omega-3 Fatty Acids (FISH OIL) 1000 MG CAPS Take by mouth.  . sildenafil (REVATIO) 20 MG tablet 2-5 tabs prn for sexual activity. No more then one dose in 24 hours.   Facility-Administered Encounter Medications as of 12/27/2017  Medication  . 0.9 %  sodium chloride infusion    Activities of Daily Living In your present state of health, do you have any difficulty performing the following activities: 12/27/2017  Hearing? N  Vision? N  Difficulty concentrating or making decisions? N  Walking or climbing stairs? N  Dressing or bathing? N  Doing errands, shopping? N  Preparing Food and eating ? N  Using the Toilet? N  In the past six months, have you accidently leaked urine? N  Do you have problems with loss of bowel control? N  Managing your Medications? N  Managing your Finances? N  Housekeeping or managing your Housekeeping? N  Some recent data might be hidden    Patient Care Team: [name], DO as PCP - General (Family Medicine)   Assessment:   This is a routine wellness examination for David Carey.  Exercise Activities and Dietary recommendations Current Exercise Habits: Home exercise routine, Type of exercise: strength training/weights;walking, Time (Minutes): 60, Frequency (Times/Week): 4, Weekly Exercise (Minutes/Week): 240, Intensity: Moderate  Goals    . Weight (lb) < 210 lb (95.3 kg)     Wife is on weight watchers  Will stop eating bread         Fall Risk Fall Risk  12/27/2017 12/23/2016 12/23/2016 12/08/2015 08/26/2015  Falls in the past year? No No No No No      Depression Screen PHQ 2/9 Scores 12/27/2017 12/23/2016 12/23/2016 12/08/2015  PHQ - 2 Score 0 0 0 0    Cognitive Function MMSE - Mini Mental State Exam 12/27/2017  12/23/2016  Not completed: (No Data) (No Data)     Ad8 score reviewed for issues:  Issues making decisions:  Less interest in hobbies / activities:  Repeats questions, stories (family complaining):  Trouble using ordinary gadgets (microwave, computer, phone):  Forgets the month or year:   Mismanaging finances:   Remembering appts:  Daily problems with thinking and/or memory: Ad8 score is=0        Immunization History  Administered Date(s) Administered  . Influenza Split 07/24/2012  . Influenza Whole 04/18/2009  . Influenza, High Dose Seasonal PF 05/28/2014, 04/08/2016  . Influenza,inj,Quad PF,6+ Mos 06/20/2013  . Influenza-Unspecified 06/26/2015  . Pneumococcal Conjugate-13 02/04/2014  . Pneumococcal Polysaccharide-23 10/03/2009, 08/26/2015  . Td 02/23/1998, 08/28/2007     Screening Tests Health Maintenance  Topic Date Due  . INFLUENZA VACCINE  02/23/2018  . COLONOSCOPY  06/09/2021  . TETANUS/TDAP  07/03/2022  . Hepatitis C Screening  Completed  . PNA vac Low Risk Adult  Completed         Plan:      PCP Notes   Health Maintenance There are no preventive care reminders to display for this patient. To have labs done by [name]  Weight up 10lbs and losing from 226 to 110 is his goal   Abnormal Screens  A1c and discussed pre-diabetes in lieu of tingling in both feet   Referrals  none  Patient concerns; Feet are numb; no falls  In pre-diabetic range last year;   To have PSA   Nurse Concerns; As noted  Next PCP apt To schedule when leaving today for apt to fup on labs and numbness in both feet       I have personally reviewed and noted the following in the patient's chart:   . Medical and social history . Use of alcohol, tobacco or illicit drugs  . Current medications  and supplements . Functional ability and status . Nutritional status . Physical activity . Advanced directives . List of other physicians . Hospitalizations, surgeries, and ER visits in previous 12 months . Vitals . Screenings to include cognitive, depression, and falls . Referrals and appointments  In addition, I have reviewed and discussed with patient certain preventive protocols, quality metrics, and best practice recommendations. A written personalized care plan for preventive services as well as general preventive health recommendations were provided to patient.     Wynetta Fines, RN  12/27/2017

## 2017-12-27 ENCOUNTER — Ambulatory Visit: Payer: Medicare Other | Admitting: Family Medicine

## 2017-12-27 ENCOUNTER — Other Ambulatory Visit: Payer: Self-pay | Admitting: Family Medicine

## 2017-12-27 ENCOUNTER — Ambulatory Visit (INDEPENDENT_AMBULATORY_CARE_PROVIDER_SITE_OTHER): Payer: Medicare Other

## 2017-12-27 ENCOUNTER — Telehealth: Payer: Self-pay | Admitting: Family Medicine

## 2017-12-27 ENCOUNTER — Ambulatory Visit (INDEPENDENT_AMBULATORY_CARE_PROVIDER_SITE_OTHER): Payer: Medicare Other | Admitting: Family Medicine

## 2017-12-27 VITALS — BP 102/62 | HR 61 | Ht 75.0 in | Wt 226.0 lb

## 2017-12-27 VITALS — BP 114/66 | Temp 98.9°F | Wt 227.0 lb

## 2017-12-27 DIAGNOSIS — R739 Hyperglycemia, unspecified: Secondary | ICD-10-CM

## 2017-12-27 DIAGNOSIS — Z6828 Body mass index (BMI) 28.0-28.9, adult: Secondary | ICD-10-CM

## 2017-12-27 DIAGNOSIS — E785 Hyperlipidemia, unspecified: Secondary | ICD-10-CM | POA: Diagnosis not present

## 2017-12-27 DIAGNOSIS — Z0000 Encounter for general adult medical examination without abnormal findings: Secondary | ICD-10-CM

## 2017-12-27 DIAGNOSIS — Z125 Encounter for screening for malignant neoplasm of prostate: Secondary | ICD-10-CM | POA: Diagnosis not present

## 2017-12-27 DIAGNOSIS — R202 Paresthesia of skin: Secondary | ICD-10-CM | POA: Diagnosis not present

## 2017-12-27 NOTE — Patient Instructions (Signed)
BEFORE YOU LEAVE: -please let him know when we try to order PSA there is a flag that his insurance will no pay for its - if he still wishes to do ok to order with waiver -labs -follow up: 4 months  Let us know if your want to see podiatrist or neurologist about the feet.  We have ordered labs or studies at this visit. It can take up to 1-2 weeks for results and processing. IF results require follow up or explanation, we will call you with instructions. Clinically stable results will be released to your [facility]. If you have not heard from [place] or cannot find your results in [facility] in 2 weeks please contact our office at [phone].  If you are not yet signed up for [facility], please consider signing up.   We recommend the following healthy lifestyle for LIFE: 1) Small portions. But, make sure to get regular (at least 3 per day), healthy meals and small healthy snacks if needed.  2) Eat a healthy clean diet.   TRY TO EAT: -at least 5-7 servings of low sugar, colorful, and nutrient rich vegetables per day (not corn, potatoes or bananas.) -berries are the best choice if you wish to eat fruit (only eat small amounts if trying to reduce weight)  -lean meets (fish, white meat of chicken or [place]) -vegan proteins for some meals - beans or tofu, whole grains, nuts and seeds -Replace bad fats with good fats - good fats include: fish, nuts and seeds, canola oil, olive oil -small amounts of low fat or non fat dairy -small amounts of100 % whole grains - check the lables -drink plenty of water  AVOID: -SUGAR, sweets, anything with added sugar, corn syrup or sweeteners - must read labels as even foods advertised as "healthy" often are loaded with sugar -if you must have a sweetener, small amounts of stevia may be best -sweetened beverages and artificially sweetened beverages -simple starches (rice, bread, potatoes, pasta, chips, etc - small amounts of 100% whole grains are ok) -red meat, pork,  butter -fried foods, fast food, processed food, excessive dairy, eggs and coconut.  3)Get at least 150 minutes of sweaty aerobic exercise per week.  4)Reduce stress - consider counseling, meditation and relaxation to balance other aspects of your life.

## 2017-12-27 NOTE — Patient Instructions (Addendum)
[name] , Thank you for taking time to come for your Medicare Wellness Visit. I appreciate your ongoing commitment to your health goals. Please review the following plan we discussed and let me know if I can assist you in the future.   Will make an apt with [name]  Please schedule your AWV when you leave today;   Educated regarding prediabetes and numbers;  A1c ranges from 5.8 to 6.5 or fasting Blood sugar > 115 -126; (126 is diabetic)   Risk: >45yo; family hx; overweight or obese; African American; Hispanic; Latino; American [place] American; [place]; history of diabetes when pregnant; or birth to a baby weighing over 9 lbs. Being less physically active than 30 minutes; 3 times a week;   Prevention; Losing a modest 7 to 8 lbs; If over 200 lbs; 10 to 14 lbs;  Choose healthier foods; colorful veggies; fish or lean meats; drinks water Reduce portion size Start exercising; 30 minutes of fast walking x 30 minutes per day/ 60 min for weight loss  Results for David Carey, David Carey (MRN [id number]) as of 12/27/2017 08:06  Ref. Range 12/08/2015 08:56 04/08/2016 08:46 12/23/2016 08:28  Glucose Latest Ref Range: 65 - 99 mg/dL 116 (H)    Hemoglobin A1C Latest Ref Range: 4.6 - 6.5 % 6.5 6.1 6.4   Shingrix is a vaccine for the prevention of Shingles in Adults 50 and older.  If you are on Medicare, the shingrix is covered under your Part D plan, so you will take both of the vaccines in the series at your pharmacy. Please check with your benefits regarding applicable copays or out of pocket expenses.  The Shingrix is given in 2 vaccines approx 8 weeks apart. You must receive the 2nd dose prior to 6 months from receipt of the first. Please have the pharmacist print out you Immunization  dates for our office records      These are the goals we discussed: Goals    . Weight (lb) < 210 lb (95.3 kg)     Wife is on weight watchers  Will stop eating bread         This is a list of the  screening recommended for you and due dates:  Health Maintenance  Topic Date Due  . Flu Shot  02/23/2018  . Colon Cancer Screening  06/09/2021  . Tetanus Vaccine  07/03/2022  .  Hepatitis C: One time screening is recommended by Center for Disease Control  (CDC) for  adults born from [birth year] through 1965.   Completed  . Pneumonia vaccines  Completed   Prevention of falls: Remove rugs or any tripping hazards in the home Use Non slip mats in bathtubs and showers Placing grab bars next to the toilet and or shower Placing handrails on both sides of the stair way Adding extra lighting in the home.   Personal safety issues reviewed:  1. Consider starting a community watch program per [facility] 2.  Changes batteries is smoke detector and/or carbon monoxide detector  3.  If you have firearms; keep them in a safe place 4.  Wear protection when in the sun; Always wear sunscreen or a hat; It is good to have your doctor check your skin annually or review any new areas of concern 5. Driving safety; Keep in the right lane; stay 3 car lengths behind the car in front of you on the highway; look 3 times prior to pulling out; carry your cell phone everywhere you  go!     Fall Prevention in the Home Falls can cause injuries. They can happen to people of all ages. There are many things you can do to make your home safe and to help prevent falls. What can I do on the outside of my home?  Regularly fix the edges of walkways and driveways and fix any cracks.  Remove anything that might make you trip as you walk through a door, such as a raised step or threshold.  Trim any bushes or trees on the path to your home.  Use bright outdoor lighting.  Clear any walking paths of anything that might make someone trip, such as rocks or tools.  Regularly check to see if handrails are loose or broken. Make sure that both sides of any steps have handrails.  Any raised decks and porches should have  guardrails on the edges.  Have any leaves, snow, or ice cleared regularly.  Use sand or salt on walking paths during winter.  Clean up any spills in your garage right away. This includes oil or grease spills. What can I do in the bathroom?  Use night lights.  Install grab bars by the toilet and in the tub and shower. Do not use towel bars as grab bars.  Use non-skid mats or decals in the tub or shower.  If you need to sit down in the shower, use a plastic, non-slip stool.  Keep the floor dry. Clean up any water that spills on the floor as soon as it happens.  Remove soap buildup in the tub or shower regularly.  Attach bath mats securely with double-sided non-slip rug tape.  Do not have throw rugs and other things on the floor that can make you trip. What can I do in the bedroom?  Use night lights.  Make sure that you have a light by your bed that is easy to reach.  Do not use any sheets or blankets that are too big for your bed. They should not hang down onto the floor.  Have a firm chair that has side arms. You can use this for support while you get dressed.  Do not have throw rugs and other things on the floor that can make you trip. What can I do in the kitchen?  Clean up any spills right away.  Avoid walking on wet floors.  Keep items that you use a lot in easy-to-reach places.  If you need to reach something above you, use a strong step stool that has a grab bar.  Keep electrical cords out of the way.  Do not use floor polish or wax that makes floors slippery. If you must use wax, use non-skid floor wax.  Do not have throw rugs and other things on the floor that can make you trip. What can I do with my stairs?  Do not leave any items on the stairs.  Make sure that there are handrails on both sides of the stairs and use them. Fix handrails that are broken or loose. Make sure that handrails are as long as the stairways.  Check any carpeting to make sure that  it is firmly attached to the stairs. Fix any carpet that is loose or worn.  Avoid having throw rugs at the top or bottom of the stairs. If you do have throw rugs, attach them to the floor with carpet tape.  Make sure that you have a light switch at the top of the stairs and the bottom  of the stairs. If you do not have them, ask someone to add them for you. What else can I do to help prevent falls?  Wear shoes that: ? Do not have high heels. ? Have rubber bottoms. ? Are comfortable and fit you well. ? Are closed at the toe. Do not wear sandals.  If you use a stepladder: ? Make sure that it is fully opened. Do not climb a closed stepladder. ? Make sure that both sides of the stepladder are locked into place. ? Ask someone to hold it for you, if possible.  Clearly mark and make sure that you can see: ? Any grab bars or handrails. ? First and last steps. ? Where the edge of each step is.  Use tools that help you move around (mobility aids) if they are needed. These include: ? Canes. ? Walkers. ? Scooters. ? Crutches.  Turn on the lights when you go into a dark area. Replace any light bulbs as soon as they burn out.  Set up your furniture so you have a clear path. Avoid moving your furniture around.  If any of your floors are uneven, fix them.  If there are any pets around you, be aware of where they are.  Review your medicines with your doctor. Some medicines can make you feel dizzy. This can increase your chance of falling. Ask your doctor what other things that you can do to help prevent falls. This information is not intended to replace advice given to you by your health care provider. Make sure you discuss any questions you have with your health care provider. Document Released: 05/08/2009 Document Revised: 12/18/2015 Document Reviewed: 08/16/2014 Elsevier Interactive Patient Education  2018 [place] Maintenance, Male A healthy lifestyle and preventive care  is important for your health and wellness. Ask your health care provider about what schedule of regular examinations is right for you. What should I know about weight and diet? Eat a Healthy Diet  Eat plenty of vegetables, fruits, whole grains, low-fat dairy products, and lean protein.  Do not eat a lot of foods high in solid fats, added sugars, or salt.  Maintain a Healthy Weight Regular exercise can help you achieve or maintain a healthy weight. You should:  Do at least 150 minutes of exercise each week. The exercise should increase your heart rate and make you sweat (moderate-intensity exercise).  Do strength-training exercises at least twice a week.  Watch Your Levels of Cholesterol and Blood Lipids  Have your blood tested for lipids and cholesterol every 5 years starting at 69 years of age. If you are at high risk for heart disease, you should start having your blood tested when you are 69 years old. You may need to have your cholesterol levels checked more often if: ? Your lipid or cholesterol levels are high. ? You are older than 69 years of age. ? You are at high risk for heart disease.  What should I know about cancer screening? Many types of cancers can be detected early and may often be prevented. Lung Cancer  You should be screened every year for lung cancer if: ? You are a current smoker who has smoked for at least 30 years. ? You are a former smoker who has quit within the past 15 years.  Talk to your health care provider about your screening options, when you should start screening, and how often you should be screened.  Colorectal Cancer  Routine colorectal cancer  screening usually begins at 68 years of age and should be repeated every 5-10 years until you are 69 years old. You may need to be screened more often if early forms of precancerous polyps or small growths are found. Your health care provider may recommend screening at an earlier age if you have risk  factors for colon cancer.  Your health care provider may recommend using home test kits to check for hidden blood in the stool.  A small camera at the end of a tube can be used to examine your colon (sigmoidoscopy or colonoscopy). This checks for the earliest forms of colorectal cancer.  Prostate and Testicular Cancer  Depending on your age and overall health, your health care provider may do certain tests to screen for prostate and testicular cancer.  Talk to your health care provider about any symptoms or concerns you have about testicular or prostate cancer.  Skin Cancer  Check your skin from head to toe regularly.  Tell your health care provider about any new moles or changes in moles, especially if: ? There is a change in a mole's size, shape, or color. ? You have a mole that is larger than a pencil eraser.  Always use sunscreen. Apply sunscreen liberally and repeat throughout the day.  Protect yourself by wearing long sleeves, pants, a wide-brimmed hat, and sunglasses when outside.  What should I know about heart disease, diabetes, and high blood pressure?  If you are 59-51 years of age, have your blood pressure checked every 3-5 years. If you are 39 years of age or older, have your blood pressure checked every year. You should have your blood pressure measured twice-once when you are at a hospital or clinic, and once when you are not at a hospital or clinic. Record the average of the two measurements. To check your blood pressure when you are not at a hospital or clinic, you can use: ? An automated blood pressure machine at a pharmacy. ? A home blood pressure monitor.  Talk to your health care provider about your target blood pressure.  If you are between 93-44 years old, ask your health care provider if you should take aspirin to prevent heart disease.  Have regular diabetes screenings by checking your fasting blood sugar level. ? If you are at a normal weight and have a  low risk for diabetes, have this test once every three years after the age of 34. ? If you are overweight and have a high risk for diabetes, consider being tested at a younger age or more often.  A one-time screening for abdominal aortic aneurysm (AAA) by ultrasound is recommended for men aged 19-75 years who are current or former smokers. What should I know about preventing infection? Hepatitis B If you have a higher risk for hepatitis B, you should be screened for this virus. Talk with your health care provider to find out if you are at risk for hepatitis B infection. Hepatitis C Blood testing is recommended for:  Everyone born from [birth year] through 1967.  Anyone with known risk factors for hepatitis C.  Sexually Transmitted Diseases (STDs)  You should be screened each year for STDs including gonorrhea and chlamydia if: ? You are sexually active and are younger than 69 years of age. ? You are older than 69 years of age and your health care provider tells you that you are at risk for this type of infection. ? Your sexual activity has changed since you were last screened  and you are at an increased risk for chlamydia or gonorrhea. Ask your health care provider if you are at risk.  Talk with your health care provider about whether you are at high risk of being infected with HIV. Your health care provider may recommend a prescription medicine to help prevent HIV infection.  What else can I do?  Schedule regular health, dental, and eye exams.  Stay current with your vaccines (immunizations).  Do not use any tobacco products, such as cigarettes, chewing tobacco, and e-cigarettes. If you need help quitting, ask your health care provider.  Limit alcohol intake to no more than 2 drinks per day. One drink equals 12 ounces of beer, 5 ounces of wine, or 1 ounces of hard liquor.  Do not use street drugs.  Do not share needles.  Ask your health care provider for help if you need support or  information about quitting drugs.  Tell your health care provider if you often feel depressed.  Tell your health care provider if you have ever been abused or do not feel safe at home. This information is not intended to replace advice given to you by your health care provider. Make sure you discuss any questions you have with your health care provider. Document Released: 01/08/2008 Document Revised: 03/10/2016 Document Reviewed: 04/15/2015 Elsevier Interactive Patient Education  [employer].

## 2017-12-27 NOTE — Telephone Encounter (Signed)
Pt requesting a refill on Revatio and send to [facility]. Pt was recently seen here in office and forgot to ask for refill.

## 2017-12-27 NOTE — Progress Notes (Signed)
HPI:  Using dictation device. Unfortunately this device frequently misinterprets words/phrases.  David Carey is a pleasant 67 y.o. here for follow up. Chronic medical problems summarized below were reviewed for changes and stability and were updated as needed below. These issues and their treatment remain stable for the most part. He saw Manuela Schwartz for his AWV today. He has some paresthesias in the bilater anterior plantar feet. He has had this for some time. Not bad and not worsening, but he wants to maybe see a specialist about it. No weakness, numbness, symptoms elsewhere.  Trying to eat healthy, but admits has not been as good about bread and weight has crept up. Getting regular exercise. Denies CP, SOB, DOE, treatment intolerance or new symptoms.  Hyperlipidemia/Hx of Obesity/Hyperglycemia: -meds: asa, 40mg  lipitor -Lifestyle: changed to healthier lifestyle and lost weight  Erectile Dysfunction: -meds: in the past on stendra 100mg  prn from prior PCP; then wanted to try viagra -hx testosterone deficiency but did not respond to testosterone gel with prior PCP  Hx nephrolithiasis: -saw urologist in the past and takes mag oxide-pyridocinefor this   ROS: See pertinent positives and negatives per HPI.  Past Medical History:  Diagnosis Date  . Colon polyps   . Diverticulosis   . Erectile dysfunction   . HERPES ZOSTER 10/03/2009   Qualifier: Diagnosis of  By: Arnoldo Morale MD, Balinda Quails   . Hyperlipidemia   . Hypogonadism male    failed testosterone tx per prior PCP notes  . Kidney stones   . 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 COLONOSCOPY    . POLYPECTOMY    . VASECTOMY    . WISDOM TOOTH EXTRACTION      Family History  Problem Relation Age of Onset  . Hyperlipidemia Mother   . Dementia Mother   . Heart disease Father   . Stroke Father 98  . Retinal detachment Unknown   . Colon cancer Neg Hx   .  Colon polyps Neg Hx   . Rectal cancer Neg Hx   . Stomach cancer Neg Hx     SOCIAL HX: see hpi   Current Outpatient Medications:  .  aspirin 81 MG tablet, Take 81 mg by mouth daily.  , Disp: , Rfl:  .  atorvastatin (LIPITOR) 40 MG tablet, TAKE ONE TABLET BY MOUTH ONE TIME DAILY AT 6PM, Disp: 90 tablet, Rfl: 0 .  magnesium oxide-pyridoxine (BEELITH) 362-20 MG TABS, Take 1 tablet by mouth daily.  , Disp: , Rfl:  .  Multiple Vitamin (MULTIVITAMIN) capsule, Take 1 capsule by mouth daily., Disp: , Rfl:  .  Omega-3 Fatty Acids (FISH OIL) 1000 MG CAPS, Take by mouth., Disp: , Rfl:  .  sildenafil (REVATIO) 20 MG tablet, 2-5 tabs prn for sexual activity. No more then one dose in 24 hours., Disp: 50 tablet, Rfl: 0  Current Facility-Administered Medications:  .  0.9 %  sodium chloride infusion, 500 mL, Intravenous, Continuous, Irene Shipper, MD  EXAM:  Vitals:   12/27/17 1559  BP: 114/66  Temp: 98.9 F (37.2 C)    Body mass index is 28.37 kg/m.  GENERAL: vitals reviewed and listed above, alert, oriented, appears well hydrated and in no acute distress  HEENT: atraumatic, conjunttiva clear, no obvious abnormalities on inspection of external nose and ears  NECK: no obvious masses on inspection  LUNGS: clear to auscultation bilaterally, no wheezes, rales or  rhonchi, good air movement  CV: HRRR, no peripheral edema  MS/NEURO: moves all extremities without noticeable abnormality no lesions or signs of trauma or infection on inspection of feet/ankles/lower legs, normal pedal pulses, normal cap refill, normal function of movement in feet and ankles, mildly abnormal monofilament testing  PSYCH: pleasant and cooperative, no obvious depression or anxiety  ASSESSMENT AND PLAN:  Discussed the following assessment and plan:  Hyperglycemia - Plan: Hemoglobin A1c  Hyperlipidemia, unspecified hyperlipidemia type - Plan: HDL cholesterol, Cholesterol, total  BMI 28.0-28.9,adult  Paresthesia -  Plan: Vitamin B12, TSH  Prostate cancer screening  -discussed potential etiologies of likely neuropathy in the feet - labs per orders and advise evaluation with podiatry or neurology, he wants to see specialist, but asked me to hold off as he wants to consult some of his physician friends first -advised a healthy low sugar diet and continued exercise -labs per orders -follow up 3-4 months   Patient Instructions  BEFORE YOU LEAVE: -please let him know when we try to order PSA there is a flag that his insurance will no pay for its - if he still wishes to do ok to order with waiver -labs -follow up: 4 months  Let us know if your want to see podiatrist or neurologist about the feet.  We have ordered labs or studies at this visit. It can take up to 1-2 weeks for results and processing. IF results require follow up or explanation, we will call you with instructions. Clinically stable results will be released to your [facility]. If you have not heard from [place] or cannot find your results in [facility] in 2 weeks please contact our office at [phone].  If you are not yet signed up for [facility], please consider signing up.   We recommend the following healthy lifestyle for LIFE: 1) Small portions. But, make sure to get regular (at least 3 per day), healthy meals and small healthy snacks if needed.  2) Eat a healthy clean diet.   TRY TO EAT: -at least 5-7 servings of low sugar, colorful, and nutrient rich vegetables per day (not corn, potatoes or bananas.) -berries are the best choice if you wish to eat fruit (only eat small amounts if trying to reduce weight)  -lean meets (fish, white meat of chicken or [place]) -vegan proteins for some meals - beans or tofu, whole grains, nuts and seeds -Replace bad fats with good fats - good fats include: fish, nuts and seeds, canola oil, olive oil -small amounts of low fat or non fat dairy -small amounts of100 % whole grains - check the lables -drink plenty of  water  AVOID: -SUGAR, sweets, anything with added sugar, corn syrup or sweeteners - must read labels as even foods advertised as "healthy" often are loaded with sugar -if you must have a sweetener, small amounts of stevia may be best -sweetened beverages and artificially sweetened beverages -simple starches (rice, bread, potatoes, pasta, chips, etc - small amounts of 100% whole grains are ok) -red meat, pork, butter -fried foods, fast food, processed food, excessive dairy, eggs and coconut.  3)Get at least 150 minutes of sweaty aerobic exercise per week.  4)Reduce stress - consider counseling, meditation and relaxation to balance other aspects of your life.          [name], DO

## 2017-12-28 LAB — VITAMIN B12: Vitamin B-12: 578 pg/mL (ref 211–911)

## 2017-12-28 LAB — HEMOGLOBIN A1C: Hgb A1c MFr Bld: 6.6 % — ABNORMAL HIGH (ref 4.6–6.5)

## 2017-12-28 LAB — HDL CHOLESTEROL: HDL: 34.2 mg/dL — ABNORMAL LOW (ref >39.00)

## 2017-12-28 LAB — CHOLESTEROL, TOTAL: Cholesterol: 151 mg/dL (ref 0–200)

## 2017-12-28 LAB — TSH: TSH: 0.53 u[IU]/mL (ref 0.35–4.50)

## 2017-12-28 NOTE — Progress Notes (Signed)
Hannah R Kim, DO  

## 2017-12-30 MED ORDER — SILDENAFIL CITRATE 20 MG PO TABS: ORAL_TABLET | ORAL | 0 refills | Status: DC

## 2017-12-30 NOTE — Telephone Encounter (Signed)
Ok to send

## 2017-12-30 NOTE — Telephone Encounter (Signed)
Rx done. 

## 2018-02-07 ENCOUNTER — Other Ambulatory Visit: Payer: Self-pay | Admitting: Family Medicine

## 2018-03-16 ENCOUNTER — Other Ambulatory Visit: Payer: Self-pay | Admitting: *Deleted

## 2018-03-16 ENCOUNTER — Telehealth: Payer: Self-pay | Admitting: Family Medicine

## 2018-03-16 MED ORDER — ATORVASTATIN CALCIUM 10 MG PO TABS: 10.0000 mg | ORAL_TABLET | Freq: Every day | ORAL | 0 refills | Status: DC

## 2018-03-16 NOTE — Telephone Encounter (Signed)
Short term Rx sent to pharmacy out of town.

## 2018-03-16 NOTE — Telephone Encounter (Signed)
Copied from [place] [id number]. Topic: Quick Communication - Rx Refill/Question >> Mar 16, 2018  9:20 AM Gardiner Ramus wrote: Medication:atorvastatin (LIPITOR) 40 MG tablet [[id number]] pt called and stated that he is out of town and forgot medication. Could he get a 10day refill. Please advise   Has the patient contacted their pharmacy? no Preferred Pharmacy (with phone number or street name):[facility] [address], [place], [place] [address]  Agent: Please be advised that RX refills may take up to 3 business days. We ask that you follow-up with your pharmacy.

## 2018-04-18 DIAGNOSIS — G609 Hereditary and idiopathic neuropathy, unspecified: Secondary | ICD-10-CM | POA: Diagnosis not present

## 2018-04-27 NOTE — Progress Notes (Signed)
HPI:  Using dictation device. Unfortunately this device frequently misinterprets words/phrases.  David Carey is a pleasant 67 y.o. here for follow up. Chronic medical problems summarized below were reviewed for changes and stability and were updated as needed below. These issues and their treatment remain stable for the most part. Reports is doing well. Diet not as good over the [place]  Exercising days/week. Denies CP, SOB, DOE, treatment intolerance or new symptoms. AWV with Manuela Schwartz was 12/27/17 Due for recheck hgba1c, flu shot  Hyperlipidemia/Hx ofObesity/Hyperglycemia: -meds: asa, 40mg  lipitor -Lifestyle:changed to healthier lifestyle and lost weight  Erectile Dysfunction: -meds: in the past on stendra 100mg  prn from prior PCP; then wanted to try viagra -hx testosterone deficiency but did not respond to testosterone gel with prior PCP  Hx nephrolithiasis: -saw urologist in the past and takes mag oxide-pyridoxinefor this  Neuropathy: -Seeing neurologist in 2019 -Peripheral in the right lateral digits of R foot -trying supplements per neurology recs, then doing recheck nerve conduction   ROS: See pertinent positives and negatives per HPI.  Past Medical History:  Diagnosis Date  . Colon polyps   . Diverticulosis   . Erectile dysfunction   . HERPES ZOSTER 10/03/2009   Qualifier: Diagnosis of  By: Arnoldo Morale MD, Balinda Quails   . Hyperlipidemia   . Hypogonadism male    failed testosterone tx per prior PCP notes  . Kidney stones   . 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 COLONOSCOPY    . POLYPECTOMY    . VASECTOMY    . WISDOM TOOTH EXTRACTION      Family History  Problem Relation Age of Onset  . Hyperlipidemia Mother   . Dementia Mother   . Heart disease Father   . Stroke Father 68  . Retinal detachment Unknown   . Colon cancer Neg Hx   . Colon polyps Neg Hx   .  Rectal cancer Neg Hx   . Stomach cancer Neg Hx     SOCIAL HX: see hpi   Current Outpatient Medications:  .  atorvastatin (LIPITOR) 40 MG tablet, TAKE ONE TABLET BY MOUTH ONE TIME DAILY AT 6PM, Disp: 90 tablet, Rfl: 1 .  magnesium oxide-pyridoxine (BEELITH) 362-20 MG TABS, Take 1 tablet by mouth daily.  , Disp: , Rfl:  .  Multiple Vitamin (MULTIVITAMIN) capsule, Take 1 capsule by mouth daily., Disp: , Rfl:  .  Omega-3 Fatty Acids (FISH OIL) 1000 MG CAPS, Take by mouth., Disp: , Rfl:  .  sildenafil (REVATIO) 20 MG tablet, 2-5 tabs prn for sexual activity. No more then one dose in 24 hours., Disp: 50 tablet, Rfl: 0  Current Facility-Administered Medications:  .  0.9 %  sodium chloride infusion, 500 mL, Intravenous, Continuous, Irene Shipper, MD  EXAM:  Vitals:   05/01/18 0805  BP: 116/72  Pulse: 62  Temp: 98 F (36.7 C)    Body mass index is 27.84 kg/m.  GENERAL: vitals reviewed and listed above, alert, oriented, appears well hydrated and in no acute distress  HEENT: atraumatic, conjunttiva clear, no obvious abnormalities on inspection of external nose and ears  NECK: no obvious masses on inspection  LUNGS: clear to auscultation bilaterally, no wheezes, rales or rhonchi, good air movement  CV: HRRR, no peripheral edema  MS: moves all extremities without noticeable abnormality  PSYCH: pleasant and cooperative, no obvious depression or  anxiety  ASSESSMENT AND PLAN:  Discussed the following assessment and plan:  Hyperglycemia - Plan: Hemoglobin A1c  Hyperlipidemia, unspecified hyperlipidemia type  Overweight (BMI 25.0-29.9)  -repeat OVAN1B -discussed implications if diabetes confirmed -recommended healthy low sugar diet, regular aerobic exercise, regular eye exams, regular checks on feet -discussed risks/benefits asa and he opted to hold -flu shot today -he is considering shingles vaccine -Follow-up 4 to 6 months, sooner as needed   Patient Instructions   BEFORE YOU LEAVE: -flu shot -lab -follow up: 4-6 months  We have ordered labs or studies at this visit. It can take up to 1-2 weeks for results and processing. IF results require follow up or explanation, we will call you with instructions. Clinically stable results will be released to your [facility]. If you have not heard from [place] or cannot find your results in [facility] in 2 weeks please contact our office at [phone].  If you are not yet signed up for [facility], please consider signing up.   We recommend the following healthy lifestyle for LIFE: 1) Small portions. But, make sure to get regular (at least 3 per day), healthy meals and small healthy snacks if needed.  2) Eat a healthy clean diet.   TRY TO EAT: -at least 5-7 servings of low sugar, colorful, and nutrient rich vegetables per day (not corn, potatoes or bananas.) -berries are the best choice if you wish to eat fruit (only eat small amounts if trying to reduce weight)  -lean meets (fish, white meat of chicken or [place]) -vegan proteins for some meals - beans or tofu, whole grains, nuts and seeds -Replace bad fats with good fats - good fats include: fish, nuts and seeds, canola oil, olive oil -small amounts of low fat or non fat dairy -small amounts of100 % whole grains - check the lables -drink plenty of water  AVOID: -SUGAR, sweets, anything with added sugar, corn syrup or sweeteners - must read labels as even foods advertised as "healthy" often are loaded with sugar -if you must have a sweetener, small amounts of stevia may be best -sweetened beverages and artificially sweetened beverages -simple starches (rice, bread, potatoes, pasta, chips, etc - small amounts of 100% whole grains are ok) -red meat, pork, butter -fried foods, fast food, processed food, excessive dairy, eggs and coconut.  3)Get at least 150 minutes of sweaty aerobic exercise per week.  4)Reduce stress - consider counseling, meditation and relaxation to  balance other aspects of your life.          [name], DO

## 2018-05-01 ENCOUNTER — Ambulatory Visit (INDEPENDENT_AMBULATORY_CARE_PROVIDER_SITE_OTHER): Payer: Medicare Other | Admitting: Family Medicine

## 2018-05-01 ENCOUNTER — Encounter: Payer: Self-pay | Admitting: Family Medicine

## 2018-05-01 VITALS — BP 116/72 | HR 62 | Temp 98.0°F | Ht 75.0 in | Wt 222.7 lb

## 2018-05-01 DIAGNOSIS — E663 Overweight: Secondary | ICD-10-CM

## 2018-05-01 DIAGNOSIS — R739 Hyperglycemia, unspecified: Secondary | ICD-10-CM | POA: Diagnosis not present

## 2018-05-01 DIAGNOSIS — E785 Hyperlipidemia, unspecified: Secondary | ICD-10-CM | POA: Diagnosis not present

## 2018-05-01 DIAGNOSIS — Z23 Encounter for immunization: Secondary | ICD-10-CM

## 2018-05-01 LAB — HEMOGLOBIN A1C: Hgb A1c MFr Bld: 6.6 % — ABNORMAL HIGH (ref 4.6–6.5)

## 2018-05-01 NOTE — Patient Instructions (Signed)
BEFORE YOU LEAVE: -flu shot -lab -follow up: 4-6 months  We have ordered labs or studies at this visit. It can take up to 1-2 weeks for results and processing. IF results require follow up or explanation, we will call you with instructions. Clinically stable results will be released to your [facility]. If you have not heard from [place] or cannot find your results in [facility] in 2 weeks please contact our office at [phone].  If you are not yet signed up for [facility], please consider signing up.   We recommend the following healthy lifestyle for LIFE: 1) Small portions. But, make sure to get regular (at least 3 per day), healthy meals and small healthy snacks if needed.  2) Eat a healthy clean diet.   TRY TO EAT: -at least 5-7 servings of low sugar, colorful, and nutrient rich vegetables per day (not corn, potatoes or bananas.) -berries are the best choice if you wish to eat fruit (only eat small amounts if trying to reduce weight)  -lean meets (fish, white meat of chicken or [place]) -vegan proteins for some meals - beans or tofu, whole grains, nuts and seeds -Replace bad fats with good fats - good fats include: fish, nuts and seeds, canola oil, olive oil -small amounts of low fat or non fat dairy -small amounts of100 % whole grains - check the lables -drink plenty of water  AVOID: -SUGAR, sweets, anything with added sugar, corn syrup or sweeteners - must read labels as even foods advertised as "healthy" often are loaded with sugar -if you must have a sweetener, small amounts of stevia may be best -sweetened beverages and artificially sweetened beverages -simple starches (rice, bread, potatoes, pasta, chips, etc - small amounts of 100% whole grains are ok) -red meat, pork, butter -fried foods, fast food, processed food, excessive dairy, eggs and coconut.  3)Get at least 150 minutes of sweaty aerobic exercise per week.  4)Reduce stress - consider counseling, meditation and relaxation to  balance other aspects of your life.

## 2018-05-01 NOTE — Addendum Note (Signed)
Addended by: Agnes Lawrence on: 05/01/2018 08:33 AM   Modules accepted: Orders

## 2018-06-05 DIAGNOSIS — Z85828 Personal history of other malignant neoplasm of skin: Secondary | ICD-10-CM | POA: Diagnosis not present

## 2018-06-05 DIAGNOSIS — X32XXXA Exposure to sunlight, initial encounter: Secondary | ICD-10-CM | POA: Diagnosis not present

## 2018-06-05 DIAGNOSIS — L298 Other pruritus: Secondary | ICD-10-CM | POA: Diagnosis not present

## 2018-06-05 DIAGNOSIS — D224 Melanocytic nevi of scalp and neck: Secondary | ICD-10-CM | POA: Diagnosis not present

## 2018-06-05 DIAGNOSIS — Z08 Encounter for follow-up examination after completed treatment for malignant neoplasm: Secondary | ICD-10-CM | POA: Diagnosis not present

## 2018-06-05 DIAGNOSIS — L570 Actinic keratosis: Secondary | ICD-10-CM | POA: Diagnosis not present

## 2018-06-05 DIAGNOSIS — D485 Neoplasm of uncertain behavior of skin: Secondary | ICD-10-CM | POA: Diagnosis not present

## 2018-06-05 DIAGNOSIS — L820 Inflamed seborrheic keratosis: Secondary | ICD-10-CM | POA: Diagnosis not present

## 2018-06-05 DIAGNOSIS — L821 Other seborrheic keratosis: Secondary | ICD-10-CM | POA: Diagnosis not present

## 2018-06-26 DIAGNOSIS — G609 Hereditary and idiopathic neuropathy, unspecified: Secondary | ICD-10-CM | POA: Diagnosis not present

## 2018-08-02 ENCOUNTER — Other Ambulatory Visit: Payer: Self-pay | Admitting: Family Medicine

## 2018-09-01 ENCOUNTER — Encounter: Payer: Self-pay | Admitting: Family Medicine

## 2018-09-01 DIAGNOSIS — E785 Hyperlipidemia, unspecified: Secondary | ICD-10-CM | POA: Insufficient documentation

## 2018-09-01 DIAGNOSIS — E1169 Type 2 diabetes mellitus with other specified complication: Secondary | ICD-10-CM | POA: Insufficient documentation

## 2018-09-01 DIAGNOSIS — G629 Polyneuropathy, unspecified: Secondary | ICD-10-CM | POA: Insufficient documentation

## 2018-09-01 DIAGNOSIS — E118 Type 2 diabetes mellitus with unspecified complications: Secondary | ICD-10-CM

## 2018-09-01 HISTORY — DX: Type 2 diabetes mellitus with unspecified complications: E11.8

## 2018-09-01 NOTE — Progress Notes (Deleted)
HPI:  Using dictation device. Unfortunately this device frequently misinterprets words/phrases.  David Carey is a pleasant 68 y.o. here for follow up. Chronic medical problems summarized below were reviewed for changes and stability and were updated as needed below. These issues and their treatment remain stable for the most part. ***. Denies CP, SOB, DOE, treatment intolerance or new symptoms. Due for foot exam. AWV with Manuela Schwartz was 12/27/17  Diabetes Type II: -with hyperlipidemia, elevated BMI, peripheral neuropathy, ED -diet controlled -seeing neurology for neuropathy  Hyperlipidemia/Hx ofObesity: -meds: asa, 40mg  lipitor -Lifestyle:changed to healthier lifestyle and lostweight  Erectile Dysfunction: -meds:in the past onstendra 100mg  prn from prior PCP; then wanted to try viagra -hx testosterone deficiency but did not respond to testosterone gel with prior PCP  Hx nephrolithiasis: -saw urologist in the past and takes mag oxide-pyridoxinefor this  Neuropathy: -Seeing neurologist in 2019 -Peripheral in the right lateral digits of R foot -trying supplements per neurology recs, then doing recheck nerve conduction   ROS: See pertinent positives and negatives per HPI.  Past Medical History:  Diagnosis Date  . Colon polyps   . Diverticulosis   . Erectile dysfunction   . HERPES ZOSTER 10/03/2009   Qualifier: Diagnosis of  By: Arnoldo Morale MD, Balinda Quails   . Hyperlipidemia   . Hypogonadism male    failed testosterone tx per prior PCP notes  . Kidney stones   . 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 COLONOSCOPY    . POLYPECTOMY    . VASECTOMY    . WISDOM TOOTH EXTRACTION      Family History  Problem Relation Age of Onset  . Hyperlipidemia Mother   . Dementia Mother   . Heart disease Father   . Stroke Father 30  . Retinal detachment Unknown   . Colon cancer Neg Hx   . Colon polyps Neg Hx    . Rectal cancer Neg Hx   . Stomach cancer Neg Hx     SOCIAL HX: ***   Current Outpatient Medications:  .  atorvastatin (LIPITOR) 40 MG tablet, TAKE ONE TABLET BY MOUTH ONE TIME DAILY AT 6 PM, Disp: 90 tablet, Rfl: 0 .  magnesium oxide-pyridoxine (BEELITH) 362-20 MG TABS, Take 1 tablet by mouth daily.  , Disp: , Rfl:  .  Multiple Vitamin (MULTIVITAMIN) capsule, Take 1 capsule by mouth daily., Disp: , Rfl:  .  Omega-3 Fatty Acids (FISH OIL) 1000 MG CAPS, Take by mouth., Disp: , Rfl:  .  sildenafil (REVATIO) 20 MG tablet, 2-5 tabs prn for sexual activity. No more then one dose in 24 hours., Disp: 50 tablet, Rfl: 0  Current Facility-Administered Medications:  .  0.9 %  sodium chloride infusion, 500 mL, Intravenous, Continuous, Irene Shipper, MD  EXAM:  There were no vitals filed for this visit.  There is no height or weight on file to calculate BMI.  GENERAL: vitals reviewed and listed above, alert, oriented, appears well hydrated and in no acute distress  HEENT: atraumatic, conjunttiva clear, no obvious abnormalities on inspection of external nose and ears  NECK: no obvious masses on inspection  LUNGS: clear to auscultation bilaterally, no wheezes, rales or rhonchi, good air movement  CV: HRRR, no peripheral edema  MS: moves all extremities without noticeable abnormality *** PSYCH: pleasant and cooperative, no obvious depression or anxiety  ASSESSMENT AND PLAN:  Discussed the following assessment and plan:  No diagnosis found.  *** -Patient advised to return or notify a doctor immediately if symptoms worsen or persist or new concerns arise.  There are no Patient Instructions on file for this visit.  [name], DO

## 2018-09-04 ENCOUNTER — Ambulatory Visit: Payer: Medicare Other | Admitting: Family Medicine

## 2018-09-04 DIAGNOSIS — Z0289 Encounter for other administrative examinations: Secondary | ICD-10-CM

## 2018-10-06 ENCOUNTER — Telehealth: Payer: Self-pay

## 2018-10-06 NOTE — Telephone Encounter (Signed)
Author phoned pt. to attempt to reschedule 6/5 AWV appointment. Appointment rescheduled for 7/21.

## 2018-11-03 ENCOUNTER — Other Ambulatory Visit: Payer: Self-pay | Admitting: Family Medicine

## 2018-12-29 ENCOUNTER — Ambulatory Visit: Payer: Medicare Other

## 2019-02-13 ENCOUNTER — Other Ambulatory Visit: Payer: Self-pay

## 2019-02-13 ENCOUNTER — Telehealth: Payer: Self-pay | Admitting: *Deleted

## 2019-02-13 ENCOUNTER — Ambulatory Visit: Payer: Medicare Other | Admitting: Family Medicine

## 2019-02-13 ENCOUNTER — Ambulatory Visit: Payer: Medicare Other

## 2019-02-13 ENCOUNTER — Ambulatory Visit (INDEPENDENT_AMBULATORY_CARE_PROVIDER_SITE_OTHER): Payer: Medicare Other | Admitting: Family Medicine

## 2019-02-13 ENCOUNTER — Telehealth: Payer: Self-pay | Admitting: Family Medicine

## 2019-02-13 DIAGNOSIS — N529 Male erectile dysfunction, unspecified: Secondary | ICD-10-CM | POA: Diagnosis not present

## 2019-02-13 DIAGNOSIS — E785 Hyperlipidemia, unspecified: Secondary | ICD-10-CM | POA: Diagnosis not present

## 2019-02-13 DIAGNOSIS — Z0000 Encounter for general adult medical examination without abnormal findings: Secondary | ICD-10-CM

## 2019-02-13 DIAGNOSIS — E1169 Type 2 diabetes mellitus with other specified complication: Secondary | ICD-10-CM

## 2019-02-13 DIAGNOSIS — Z125 Encounter for screening for malignant neoplasm of prostate: Secondary | ICD-10-CM | POA: Diagnosis not present

## 2019-02-13 DIAGNOSIS — G6289 Other specified polyneuropathies: Secondary | ICD-10-CM

## 2019-02-13 DIAGNOSIS — E118 Type 2 diabetes mellitus with unspecified complications: Secondary | ICD-10-CM

## 2019-02-13 NOTE — Progress Notes (Signed)
Error

## 2019-02-13 NOTE — Telephone Encounter (Signed)
-----   Message from [name], DO sent at 02/13/2019  3:40 PM EDT ----- Hi,  Just finished David Carey AWV.  Could you please call him today or tomorrow to complete the Medicare form? I let him know you would be calling to go over those questions. Thanks!  Also, I sent a request to scheduling for his lab visit - he wanted an appointment today or tomorrow as reports he came into town only for that, but that his AWV with a nurse was canceled.  He needs a virtual follow up with me in 3 months and a TOC with Dr. Ethlyn Gallery in 6 months.Please schedule for him.  Thanks!

## 2019-02-13 NOTE — Telephone Encounter (Signed)
I left a detailed message at the pts cell number with the information below and asked that he call back for an appt.

## 2019-02-13 NOTE — Telephone Encounter (Signed)
-----   Message from [name], DO sent at 02/13/2019 12:18 PM EDT ----- Kermit Balo Afternoon!  Just tried to connect with Herbie Baltimore, but was unable to. My Internet strength was great - but he was having difficulty on his end it seems. Was able to use the chat and he is ok with booking a phone conversation this afternoon for his Annual visit/AWV as we used up his visit time trying to connect.   Please block a time this afternoon and go over the Medicare Form questions and HM with him. Let me know what time you blocked for me to call him. Thank you!  Jarrett Soho

## 2019-02-13 NOTE — Progress Notes (Signed)
Medicare Annual Preventive Care Visit  (initial annual wellness or annual wellness exam)  Virtual Visit via Video Note  I connected with David Carey  on 12/12/18 at  3:20 PM EDT by a video enabled telemedicine application and verified that I am speaking with the correct person using two identifiers.  Location patient: home Location provider:work or home office Persons participating in the virtual visit: patient, provider  Concerns and/or follow up today:  See HM section in Epic for other details of completed HM. See scanned documentation under Media Tab for further documentation HPI, health risk assessment. See Media Tab and Care Teams sections in Epic for other providers.   David Carey is a pleasant 68 y.o. here for follow up. Chronic medical problems summarized below were reviewed for changes and stability and were updated as needed below. These issues and their treatment remain stable for the most part.  Reports is exercising and has been trying to eat healthier. Sees neuro for neuropathy. Has lost some weight intentionally.  He [place] [facility] in [place] - reports saw them about April of last year, but exam this year was postponed due to the coronavirus pandemic. [place] [employer] during the pandemic. In town for 2 days and wants labs. Denies CP, SOB, DOE, treatment intolerance or new symptoms.  HM due: -diabetic eye exam -labs-Urine micro/alb  Hyperglycemia/Mild Diabetes: -preferred lifestyle changes -on statin -has worked hard and reports weight down some.   Hyperlipidemia/Hx ofObesity: -meds: asa, 40mg  lipitor -Lifestyle:changed to healthier lifestyle and lostweight  Erectile Dysfunction: -meds:in the past onstendra 100mg  prn from prior PCP; then wanted to try viagra -hx testosterone deficiency but did not respond to testosterone gel with prior PCP  Hx nephrolithiasis: -saw urologist in the past and takes mag oxide-pyridoxinefor  this  Neuropathy: -Seeing neurologist  ROS: negative for report of fevers, unintentional weight loss, vision changes, vision loss, hearing loss or change, chest pain, sob, hemoptysis, melena, hematochezia, hematuria, genital discharge or lesions, falls, bleeding or bruising, loc, thoughts of suicide or self harm, memory loss  1.) Patient-completed health risk assessment  - completed and reviewed, see scanned documentation  2.) Review of Medical History: -PMH, PSH, Family History and current specialty and care providers reviewed and updated and listed below  - see scanned in document in chart and below Patient Care Team: [name], DO as PCP - General (Family Medicine)   Past Medical History:  Diagnosis Date  . Colon polyps   . Diverticulosis   . Erectile dysfunction   . HERPES ZOSTER 10/03/2009   Qualifier: Diagnosis of  By: Arnoldo Morale MD, Balinda Quails   . Hyperlipidemia   . Hypogonadism male    failed testosterone tx per prior PCP notes  . Kidney stones   . 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 Type 2 diabetes mellitus with unspecified complications ([place]) 12/27/6801   -with elevated BMP, hyperlipidemia, ED    Past Surgical History:  Procedure Laterality Date  . COLONOSCOPY    . POLYPECTOMY    . VASECTOMY    . WISDOM TOOTH EXTRACTION      Social History   Socioeconomic History  . Marital status: Married    Spouse name: Not on file  . Number of children: Not on file  . Years of education: Not on file  . Highest education level: Not on file  Occupational History  . Not on file  Social Needs  .  Financial resource strain: Not on file  . Food insecurity    Worry: Not on file    Inability: Not on file  . Transportation needs    Medical: Not on file    Non-medical: Not on file  Tobacco Use  . Smoking status: Former Smoker    Types: Cigarettes    Quit date: 06/27/1994    Years since quitting: 24.6  . Smokeless tobacco: Never Used  . Tobacco  comment: quit in 2002 - 14 yo   Substance and Sexual Activity  . Alcohol use: No  . Drug use: No  . Sexual activity: Yes  Lifestyle  . Physical activity    Days per week: Not on file    Minutes per session: Not on file  . Stress: Not on file  Relationships  . Social Herbalist on phone: Not on file    Gets together: Not on file    Attends religious service: Not on file    Active member of club or organization: Not on file    Attends meetings of clubs or organizations: Not on file    Relationship status: Not on file  . Intimate partner violence    Fear of current or ex partner: Not on file    Emotionally abused: Not on file    Physically abused: Not on file    Forced sexual activity: Not on file  Other Topics Concern  . 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 Hyperlipidemia Mother   . Dementia Mother   . Heart disease Father   . Stroke Father 71  . Retinal detachment Unknown   . Colon cancer Neg Hx   . Colon polyps Neg Hx   . Rectal cancer Neg Hx   . Stomach cancer Neg Hx     Current Outpatient Medications on File Prior to Visit  Medication Sig Dispense Refill  . atorvastatin (LIPITOR) 40 MG tablet TAKE ONE TABLET BY MOUTH ONE TIME DAILY AT 6 PM. 90 tablet 0  . magnesium oxide-pyridoxine (BEELITH) 362-20 MG TABS Take 1 tablet by mouth daily.      . Multiple Vitamin (MULTIVITAMIN) capsule Take 1 capsule by mouth daily.    . Omega-3 Fatty Acids (FISH OIL) 1000 MG CAPS Take by mouth.    . sildenafil (REVATIO) 20 MG tablet 2-5 tabs prn for sexual activity. No more then one dose in 24 hours. 50 tablet 0   Current Facility-Administered Medications on File Prior to Visit  Medication Dose Route Frequency Provider  Last Rate Last Dose  . 0.9 %  sodium chloride infusion  500 mL Intravenous Continuous Irene Shipper, MD         3.) Review of functional ability and level of safety:  Any difficulty hearing?  See scanned documentation  History of falling?  See scanned documentation  Any trouble with IADLs - using a phone, using transportation, grocery shopping, preparing meals, doing housework, doing laundry, taking medications and managing money?  See scanned documentation  Advance Directives? Already done and no changes desired.  See summary of recommendations in Patient Instructions below.  4.) Physical Exam There  were no vitals filed for this visit. Estimated body mass index is 27.84 kg/m as calculated from the following:   Height as of 05/01/18: 6\' 3"  (1.905 m).   Weight as of 05/01/18: 222 lb 11.2 oz (101 kg). BP: 110/70  EKG (optional): deferred  VITALS per patient if applicable:    GENERAL: alert, oriented, appears well and in no acute distress; visual acuity grossly intact, full vision exam deferred due to pandemic and/or virtual encounter  HEENT: atraumatic, conjunttiva clear, no obvious abnormalities on inspection of external nose and ears  NECK: normal movements of the head and neck  LUNGS: on inspection no signs of respiratory distress, breathing rate appears normal, no obvious gross SOB, gasping or wheezing  CV: no obvious cyanosis  MS: moves all visible extremities without noticeable abnormality  PSYCH/NEURO: pleasant and cooperative, no obvious depression or anxiety, speech and thought processing grossly intact, Cognitive function grossly intact  Depression screen [facility] 2/9 12/27/2017 12/23/2016 12/23/2016 12/08/2015 08/26/2015  Decreased Interest 0 0 0 0 0  Down, Depressed, Hopeless 0 0 0 0 0  PHQ - 2 Score 0 0 0 0 0     See patient instructions for recommendations.  Education and counseling regarding the above review of health provided with a plan for the  following: -see scanned patient completed form for further details -fall prevention strategies discussed - denies fall risk -healthy lifestyle discussed -importance and resources for completing advanced directives discussed - he has completed, does not wish to change -see patient instructions below for any other recommendations provided  4)The following written screening schedule of preventive measures were reviewed with assessment and plan made per below, orders and patient instructions:      AAA screening done if applicable     Alcohol screening done     Obesity Screening and counseling done     STI screening (Hep C if born 1947-65) offered and per pt wishes     Tobacco Screening done       Pneumococcal (PPSV23 -one dose after 64, one before if risk factors), influenza yearly and hepatitis B vaccines (if high risk - end stage renal disease, IV drugs, homosexual men, live in home for mentally retarded, hemophilia receiving factors) ASSESSMENT/PLAN: done if applicable      Prostate cancer screening ASSESSMENT/PLAN: discussed options, limitations, risks; he wishes to check PSA      Colorectal cancer screening (FOBT yearly or flex sig q4y or colonoscopy q10y or barium enema q4y) ASSESSMENT/PLAN: utd -does with Dr. Henrene Pastor      Diabetes outpatient self-management training services ASSESSMENT/PLAN:done      Screening for glaucoma(q1y if high risk - diabetes, FH, AA and > 50 or hispanic and > 65) ASSESSMENT/PLAN:  advised      Medical nutritional therapy for individuals with diabetes or renal disease ASSESSMENT/PLAN: see orders      Cardiovascular screening blood tests (lipids q5y) ASSESSMENT/PLAN: see orders and labs      Diabetes screening tests ASSESSMENT/PLAN: see orders and labs   7.) Summary:   Medicare annual wellness visit, subsequent -  -risk factors and conditions per above assessment were discussed and treatment, recommendations and referrals were offered per  documentation above and orders and patient instructions.  Type 2 diabetes mellitus with unspecified complications ([place]) - Plan:  -labs per orders -healthy lifestyle: low sugar healthy diet and regular exercise -diabetic eye exam advised -preferred to postpone foot exam given pandemic  Hyperlipidemia associated with type 2 diabetes mellitus ([employer]) -refilled statin -labs  Other polyneuropathy - seeing neurology  Patient Instructions    David Carey , Thank you for taking time to come for your Medicare Wellness Visit. I appreciate your ongoing commitment to your health goals. Please review the following plan we discussed and let me know if I can assist you in the future.   Lab visit for labs  - if someone does not call you by noon tomorrow, please call our office  Schedule your diabetic eye exam  Goals      Weight   . Weight (lb) < 210 lb (95.3 kg)          Lab visit for labs  - if someone does not call you by noon tomorrow, please call our office  Schedule diabetic eye exam  This is a list of the screening recommended for you and due dates:  Health Maintenance  Topic Date Due  . Urine Protein Check  08/23/1958  . Hemoglobin A1C  10/31/2018  . Complete foot exam   02/13/2020*  . Eye exam for diabetics  02/13/2020*  . Flu Shot  02/24/2019  . Colon Cancer Screening  06/09/2021  . Tetanus Vaccine  07/03/2022  .  Hepatitis C: One time screening is recommended by Center for Disease Control  (CDC) for  adults born from [birth year] through 1967.   Completed  . Pneumonia vaccines  Completed  *Topic was postponed. The date shown is not the original due date.    [name], DO

## 2019-02-13 NOTE — Patient Instructions (Addendum)
  David Carey , Thank you for taking time to come for your Medicare Wellness Visit. I appreciate your ongoing commitment to your health goals. Please review the following plan we discussed and let me know if I can assist you in the future.   Lab visit for labs  - if someone does not call you by noon tomorrow, please call our office  Schedule your diabetic eye exam  Goals      Weight   . Weight (lb) < 210 lb (95.3 kg)          Lab visit for labs  - if someone does not call you by noon tomorrow, please call our office  Schedule diabetic eye exam  This is a list of the screening recommended for you and due dates:  Health Maintenance  Topic Date Due  . Urine Protein Check  08/23/1958  . Hemoglobin A1C  10/31/2018  . Complete foot exam   02/13/2020*  . Eye exam for diabetics  02/13/2020*  . Flu Shot  02/24/2019  . Colon Cancer Screening  06/09/2021  . Tetanus Vaccine  07/03/2022  .  Hepatitis C: One time screening is recommended by Center for Disease Control  (CDC) for  adults born from [birth year] through 1965.   Completed  . Pneumonia vaccines  Completed  *Topic was postponed. The date shown is not the original due date.

## 2019-02-13 NOTE — Telephone Encounter (Signed)
I called the pt, completed the Medicare Wellness form and faxed this to Dr Maudie Mercury at [phone].  Appts scheduled as below.

## 2019-02-13 NOTE — Telephone Encounter (Signed)
Spoke with David Carey on 02/12/19 regarding AWV. Patient asked for AWV scheduled on 02/13/19 to be canceled and asked that a yearly check up appointment be scheduled with [name] on 02/13/19 instead. Scheduled patient with [name] for 12pm on 02/13/19. SF

## 2019-02-14 LAB — LIPID PANEL
Cholesterol: 145 mg/dL (ref 0–200)
HDL: 36.8 mg/dL — ABNORMAL LOW (ref >39.00)
LDL Cholesterol: 81 mg/dL (ref 0–99)
NonHDL: 108.49
Total CHOL/HDL Ratio: 4
Triglycerides: 136 mg/dL (ref 0.0–149.0)
VLDL: 27.2 mg/dL (ref 0.0–40.0)

## 2019-02-14 LAB — MICROALBUMIN / CREATININE URINE RATIO
Creatinine,U: 152.9 mg/dL
Microalb Creat Ratio: 0.5 mg/g (ref 0.0–30.0)
Microalb, Ur: <0.7 mg/dL (ref 0.0–1.9)

## 2019-02-14 LAB — BASIC METABOLIC PANEL
BUN: 15 mg/dL (ref 6–23)
CO2: 28 mEq/L (ref 19–32)
Calcium: 9.5 mg/dL (ref 8.4–10.5)
Chloride: 105 mEq/L (ref 96–112)
Creatinine, Ser: 0.97 mg/dL (ref 0.40–1.50)
GFR: 76.41 mL/min (ref >60.00)
Glucose, Bld: 106 mg/dL — ABNORMAL HIGH (ref 70–99)
Potassium: 4.3 mEq/L (ref 3.5–5.1)
Sodium: 141 mEq/L (ref 135–145)

## 2019-02-14 LAB — HEMOGLOBIN A1C: Hgb A1c MFr Bld: 6.4 % (ref 4.6–6.5)

## 2019-02-14 LAB — PSA, MEDICARE: PSA: 2.02 ng/ml (ref 0.10–4.00)

## 2019-02-14 NOTE — Addendum Note (Signed)
Addended by: Elmer Picker on: 02/14/2019 08:51 AM   Modules accepted: Orders

## 2019-02-14 NOTE — Addendum Note (Signed)
Addended by: Elmer Picker on: 02/14/2019 08:02 AM   Modules accepted: Orders

## 2019-02-20 ENCOUNTER — Other Ambulatory Visit: Payer: Self-pay | Admitting: Family Medicine

## 2019-05-01 DIAGNOSIS — Z23 Encounter for immunization: Secondary | ICD-10-CM | POA: Diagnosis not present

## 2019-05-15 ENCOUNTER — Ambulatory Visit: Payer: Medicare Other | Admitting: Family Medicine

## 2019-05-28 ENCOUNTER — Other Ambulatory Visit: Payer: Self-pay | Admitting: Family Medicine

## 2019-06-11 DIAGNOSIS — X32XXXA Exposure to sunlight, initial encounter: Secondary | ICD-10-CM | POA: Diagnosis not present

## 2019-06-11 DIAGNOSIS — L988 Other specified disorders of the skin and subcutaneous tissue: Secondary | ICD-10-CM | POA: Diagnosis not present

## 2019-06-11 DIAGNOSIS — D485 Neoplasm of uncertain behavior of skin: Secondary | ICD-10-CM | POA: Diagnosis not present

## 2019-06-11 DIAGNOSIS — L820 Inflamed seborrheic keratosis: Secondary | ICD-10-CM | POA: Diagnosis not present

## 2019-06-11 DIAGNOSIS — Z85828 Personal history of other malignant neoplasm of skin: Secondary | ICD-10-CM | POA: Diagnosis not present

## 2019-06-11 DIAGNOSIS — D224 Melanocytic nevi of scalp and neck: Secondary | ICD-10-CM | POA: Diagnosis not present

## 2019-06-11 DIAGNOSIS — L570 Actinic keratosis: Secondary | ICD-10-CM | POA: Diagnosis not present

## 2019-06-11 DIAGNOSIS — Z08 Encounter for follow-up examination after completed treatment for malignant neoplasm: Secondary | ICD-10-CM | POA: Diagnosis not present

## 2019-06-20 ENCOUNTER — Other Ambulatory Visit: Payer: Self-pay

## 2019-08-13 ENCOUNTER — Encounter: Payer: Self-pay | Admitting: Family Medicine

## 2019-08-13 ENCOUNTER — Other Ambulatory Visit: Payer: Self-pay

## 2019-08-13 ENCOUNTER — Ambulatory Visit (INDEPENDENT_AMBULATORY_CARE_PROVIDER_SITE_OTHER): Payer: Medicare Other | Admitting: Family Medicine

## 2019-08-13 VITALS — BP 124/82 | HR 60 | Temp 97.8°F | Ht 75.0 in | Wt 223.1 lb

## 2019-08-13 DIAGNOSIS — G6289 Other specified polyneuropathies: Secondary | ICD-10-CM

## 2019-08-13 DIAGNOSIS — R739 Hyperglycemia, unspecified: Secondary | ICD-10-CM | POA: Insufficient documentation

## 2019-08-13 DIAGNOSIS — E785 Hyperlipidemia, unspecified: Secondary | ICD-10-CM

## 2019-08-13 HISTORY — DX: Hyperglycemia, unspecified: R73.9

## 2019-08-13 LAB — CBC WITH DIFFERENTIAL/PLATELET
Basophils Absolute: 0 10*3/uL (ref 0.0–0.1)
Basophils Relative: 0.2 % (ref 0.0–3.0)
Eosinophils Absolute: 0.2 10*3/uL (ref 0.0–0.7)
Eosinophils Relative: 4.2 % (ref 0.0–5.0)
HCT: 43.4 % (ref 39.0–52.0)
Hemoglobin: 14.4 g/dL (ref 13.0–17.0)
Lymphocytes Relative: 29.6 % (ref 12.0–46.0)
Lymphs Abs: 1.5 10*3/uL (ref 0.7–4.0)
MCHC: 33.2 g/dL (ref 30.0–36.0)
MCV: 98.8 fl (ref 78.0–100.0)
Monocytes Absolute: 0.4 10*3/uL (ref 0.1–1.0)
Monocytes Relative: 8.3 % (ref 3.0–12.0)
Neutro Abs: 3 10*3/uL (ref 1.4–7.7)
Neutrophils Relative %: 57.7 % (ref 43.0–77.0)
Platelets: 197 10*3/uL (ref 150.0–400.0)
RBC: 4.39 Mil/uL (ref 4.22–5.81)
RDW: 13.2 % (ref 11.5–15.5)
WBC: 5.1 10*3/uL (ref 4.0–10.5)

## 2019-08-13 LAB — LIPID PANEL
Cholesterol: 149 mg/dL (ref 0–200)
HDL: 42.2 mg/dL (ref >39.00)
LDL Cholesterol: 87 mg/dL (ref 0–99)
NonHDL: 106.33
Total CHOL/HDL Ratio: 4
Triglycerides: 99 mg/dL (ref 0.0–149.0)
VLDL: 19.8 mg/dL (ref 0.0–40.0)

## 2019-08-13 LAB — HEMOGLOBIN A1C: Hgb A1c MFr Bld: 6.5 % (ref 4.6–6.5)

## 2019-08-13 LAB — COMPREHENSIVE METABOLIC PANEL
ALT: 9 U/L (ref 0–53)
AST: 17 U/L (ref 0–37)
Albumin: 4.3 g/dL (ref 3.5–5.2)
Alkaline Phosphatase: 73 U/L (ref 39–117)
BUN: 17 mg/dL (ref 6–23)
CO2: 28 mEq/L (ref 19–32)
Calcium: 9.2 mg/dL (ref 8.4–10.5)
Chloride: 104 mEq/L (ref 96–112)
Creatinine, Ser: 0.84 mg/dL (ref 0.40–1.50)
GFR: 90.08 mL/min (ref >60.00)
Glucose, Bld: 99 mg/dL (ref 70–99)
Potassium: 4.2 mEq/L (ref 3.5–5.1)
Sodium: 139 mEq/L (ref 135–145)
Total Bilirubin: 0.8 mg/dL (ref 0.2–1.2)
Total Protein: 7.1 g/dL (ref 6.0–8.3)

## 2019-08-13 MED ORDER — ATORVASTATIN CALCIUM 40 MG PO TABS: ORAL_TABLET | ORAL | 1 refills | Status: DC

## 2019-08-13 MED ORDER — SILDENAFIL CITRATE 20 MG PO TABS: ORAL_TABLET | ORAL | 5 refills | Status: AC

## 2019-08-13 NOTE — Patient Instructions (Signed)
Consider shingles vaccine once COVID situation improves

## 2019-08-13 NOTE — Addendum Note (Signed)
Addended by: Suzette Battiest on: 08/13/2019 11:04 AM   Modules accepted: Orders

## 2019-08-13 NOTE — Progress Notes (Signed)
David Carey DOB: July 12, 1951 Encounter date: 08/13/2019  This is a 69 y.o. male who presents to establish care. Chief Complaint  Patient presents with  . Transfer of Care    History of present illness: No specific concerns today.   Blood work was July/2020  DMII: Last A1c was 6.4.  Diagnosis has been changed hyperglycemia, but we will plan to recheck A1c with next set of blood work. Not checking sugars at home. Trying to watch what he is eating.   HL: Lipitor 40 mg. No muscle aches/cramps with medication. Also taking lovaza.   Peripheral neuropathy: Follows with neurology. Has had burning sensation; not pain. Had EMG testing and was put on alphalipoic acid which has helped. Different feeling in pads of feet. Not keeping him awake at night.   Hx of squamous cell carcinoma hand: last visit was 6 months ago.  Kidney stones: Taking magnesium oxide for this. Hasn't had one in 10 years.   Erectile dysfunction: Sildenafil as needed  Follows with eye doctor regularly in [place]. Follows annually.  Last colonoscopy 2017; repeat in 2022 due to polyps  Past Medical History:  Diagnosis Date  . Colon polyps   . Diverticulosis   . Erectile dysfunction   . HERPES ZOSTER 10/03/2009   Qualifier: Diagnosis of  By: Arnoldo Morale MD, Balinda Quails   . Hyperlipidemia   . Hypogonadism male    failed testosterone tx per prior PCP notes  . Kidney stones   . 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 Type 2 diabetes mellitus with unspecified complications ([place]) [id number]   -with elevated BMP, hyperlipidemia, ED   Past Surgical History:  Procedure Laterality Date  . COLONOSCOPY    . POLYPECTOMY    . VASECTOMY    . WISDOM TOOTH EXTRACTION     No Known Allergies Current Meds  Medication Sig  . atorvastatin (LIPITOR) 40 MG tablet TAKE ONE TABLET BY MOUTH ONE TIME DAILY AT 6PM  . Cyanocobalamin (VITAMIN B 12 PO) Take 100 mcg by mouth daily.  . Magnesium Oxide 200 MG TABS Take by  mouth.  . Multiple Vitamin (MULTIVITAMIN) capsule Take 1 capsule by mouth daily.  Marland Kitchen omega-3 acid ethyl esters (LOVAZA) 1 g capsule Take by mouth.  . sildenafil (REVATIO) 20 MG tablet 2-5 tabs prn for sexual activity. No more then one dose in 24 hours.  . [DISCONTINUED] atorvastatin (LIPITOR) 40 MG tablet TAKE ONE TABLET BY MOUTH ONE TIME DAILY AT 6PM  . [DISCONTINUED] sildenafil (REVATIO) 20 MG tablet 2-5 tabs prn for sexual activity. No more then one dose in 24 hours.   Current Facility-Administered Medications for the 08/13/19 encounter (Office Visit) with Caren Macadam, MD  Medication  . 0.9 %  sodium chloride infusion   Social History   Tobacco Use  . Smoking status: Former Smoker    Packs/day: 0.50    Years: 20.00    Pack years: 10.00    Types: Cigarettes    Quit date: 06/27/1994    Years since quitting: 25.1  . Smokeless tobacco: Never Used  . Tobacco comment: quit in 2002 - 14 yo   Substance Use Topics  . Alcohol use: No   Family History  Problem Relation Age of Onset  . Dementia Mother 24  . Heart disease Father   . Stroke Father 35  . Hyperlipidemia Father   . CAD Father   . Retinal detachment Other   . Alcohol abuse Brother   .  CAD Paternal Grandfather   . Colon cancer Neg Hx   . Colon polyps Neg Hx   . Rectal cancer Neg Hx   . Stomach cancer Neg Hx      Review of Systems  Constitutional: Negative for chills, fatigue and fever.  Respiratory: Negative for cough, chest tightness, shortness of breath and wheezing.   Cardiovascular: Negative for chest pain, palpitations and leg swelling.    Objective:  BP 124/82 (BP Location: Left Arm, Patient Position: Sitting, Cuff Size: Normal)   Pulse 60   Temp 97.8 F (36.6 C) (Temporal)   Ht 6\' 3"  (1.905 m)   Wt 223 lb 2 oz (101.2 kg)   SpO2 98%   BMI 27.89 kg/m   Weight: 223 lb 2 oz (101.2 kg)   BP Readings from Last 3 Encounters:  08/13/19 124/82  05/01/18 116/72  12/27/17 114/66   Wt Readings from  Last 3 Encounters:  08/13/19 223 lb 2 oz (101.2 kg)  05/01/18 222 lb 11.2 oz (101 kg)  12/27/17 227 lb (103 kg)    EXAM: Assessment/Plan  1. Hyperglycemia Will recheck A1C. Previously well controlled.  - Hemoglobin A1c; Future  2. Hyperlipidemia, unspecified hyperlipidemia type Cont with lipitor - Comprehensive metabolic panel; Future - Lipid panel; Future  3. Other polyneuropathy - CBC with Differential/Platelet; Future   Return in about 6 months (around 02/10/2020) for CCV/physical exam; AWV with [name] in 3 mo time.   Micheline Rough, MD

## 2019-08-24 DIAGNOSIS — Z23 Encounter for immunization: Secondary | ICD-10-CM | POA: Diagnosis not present

## 2019-09-21 DIAGNOSIS — Z23 Encounter for immunization: Secondary | ICD-10-CM | POA: Diagnosis not present

## 2020-02-07 ENCOUNTER — Other Ambulatory Visit: Payer: Self-pay | Admitting: Family Medicine

## 2020-02-11 ENCOUNTER — Ambulatory Visit: Payer: Medicare Other | Admitting: Family Medicine

## 2020-02-14 ENCOUNTER — Other Ambulatory Visit: Payer: Self-pay

## 2020-02-14 ENCOUNTER — Ambulatory Visit (INDEPENDENT_AMBULATORY_CARE_PROVIDER_SITE_OTHER): Payer: Medicare Other | Admitting: Family Medicine

## 2020-02-14 ENCOUNTER — Encounter: Payer: Self-pay | Admitting: Family Medicine

## 2020-02-14 VITALS — BP 120/80 | HR 60 | Temp 98.2°F | Ht 75.0 in | Wt 227.0 lb

## 2020-02-14 DIAGNOSIS — G6289 Other specified polyneuropathies: Secondary | ICD-10-CM | POA: Diagnosis not present

## 2020-02-14 DIAGNOSIS — R739 Hyperglycemia, unspecified: Secondary | ICD-10-CM | POA: Diagnosis not present

## 2020-02-14 DIAGNOSIS — E538 Deficiency of other specified B group vitamins: Secondary | ICD-10-CM

## 2020-02-14 DIAGNOSIS — E785 Hyperlipidemia, unspecified: Secondary | ICD-10-CM | POA: Diagnosis not present

## 2020-02-14 DIAGNOSIS — E559 Vitamin D deficiency, unspecified: Secondary | ICD-10-CM | POA: Diagnosis not present

## 2020-02-14 MED ORDER — ATORVASTATIN CALCIUM 40 MG PO TABS: 40.0000 mg | ORAL_TABLET | Freq: Every day | ORAL | 3 refills | Status: DC

## 2020-02-14 MED ORDER — SHINGRIX 50 MCG/0.5ML IM SUSR: 0.5000 mL | Freq: Once | INTRAMUSCULAR | 0 refills | Status: AC

## 2020-02-14 NOTE — Progress Notes (Signed)
David Carey DOB: 1951/05/10 Encounter date: 02/14/2020  This is a 69 y.o. male who presents with Chief Complaint  Patient presents with  . Diabetes  . Hyperlipidemia    History of present illness: Last visit was in January 2021. No concerns today. Not getting out as much as "normal"; gradually increasing activities.   DMII: Last A1c was 6.4.  Diagnosis has been changed hyperglycemia, but we will plan to recheck A1c with next set of blood work. Not checking sugars at home. Trying to watch what he is eating. not doing as well as he could with healthy eating. Snacking is problem.   HL: Lipitor 40 mg. No muscle aches/cramps with medication. Also taking lovaza. still doing well with this.  Peripheral neuropathy: Follows with neurology. Has had burning sensation; not pain. Not keeping him awake at night. not worse or better. Supposed to follow up with neurology soon; appointment got "messed up" with COVID.   Hx of squamous cell carcinoma hand: last visit was 6 months ago. Following with derm regularly.   Kidney stones: Taking magnesium oxide for this. Hasn't had one in 10 years.   Erectile dysfunction: Sildenafil as needed  Follows with eye doctor regularly in [place]. Follows annually.  Last colonoscopy 2017; repeat in 2022 due to polyps  Sleeps well. Feels that overall energy level is down somewhat.   No Known Allergies Current Meds  Medication Sig  . atorvastatin (LIPITOR) 40 MG tablet Take 1 tablet (40 mg total) by mouth daily.  . Cyanocobalamin (VITAMIN B 12 PO) Take 100 mcg by mouth daily.  . Magnesium Oxide 200 MG TABS Take by mouth.  . Multiple Vitamin (MULTIVITAMIN) capsule Take 1 capsule by mouth daily.  Marland Kitchen omega-3 acid ethyl esters (LOVAZA) 1 g capsule Take by mouth.  . sildenafil (REVATIO) 20 MG tablet 2-5 tabs prn for sexual activity. No more then one dose in 24 hours.  . [DISCONTINUED] atorvastatin (LIPITOR) 40 MG tablet TAKE ONE TABLET BY MOUTH ONE  TIME DAILY AT 6PM   Current Facility-Administered Medications for the 02/14/20 encounter (Office Visit) with Caren Macadam, MD  Medication  . 0.9 %  sodium chloride infusion    Review of Systems  Constitutional: Negative for chills, fatigue and fever.  Respiratory: Negative for cough, chest tightness, shortness of breath and wheezing.   Cardiovascular: Negative for chest pain, palpitations and leg swelling.    Objective:  BP 120/80 (BP Location: Left Arm, Patient Position: Sitting, Cuff Size: Large)   Pulse 60   Temp 98.2 F (36.8 C) (Oral)   Ht 6\' 3"  (1.905 m)   Wt 227 lb (103 kg)   BMI 28.37 kg/m   Weight: 227 lb (103 kg)   BP Readings from Last 3 Encounters:  02/14/20 120/80  08/13/19 124/82  05/01/18 116/72   Wt Readings from Last 3 Encounters:  02/14/20 227 lb (103 kg)  08/13/19 223 lb 2 oz (101.2 kg)  05/01/18 222 lb 11.2 oz (101 kg)    Physical Exam Constitutional:      General: He is not in acute distress.    Appearance: He is well-developed.  HENT:     Head: Normocephalic and atraumatic.  Cardiovascular:     Rate and Rhythm: Normal rate and regular rhythm.     Heart sounds: Normal heart sounds. No murmur heard.   Pulmonary:     Effort: Pulmonary effort is normal.     Breath sounds: Normal breath sounds.  Abdominal:     General:  Bowel sounds are normal. There is no distension.     Palpations: Abdomen is soft.     Tenderness: There is no abdominal tenderness. There is no guarding.  Skin:    General: Skin is warm and dry.     Comments: Sensory exam of the foot is normal, tested with the monofilament. Good pulses, no lesions or ulcers, good peripheral pulses.  Psychiatric:        Judgment: Judgment normal.     Assessment/Plan  1. Hyperlipidemia, unspecified hyperlipidemia type Continue Lipitor.  Well-controlled. - Lipid panel; Future - Lipid panel  2. Hyperglycemia Diet controlled.  We will recheck blood work.  Has not been eating as well  as typical. - Hemoglobin A1c; Future - HM DIABETES FOOT EXAM - Microalbumin / creatinine urine ratio; Future - Microalbumin / creatinine urine ratio - Hemoglobin A1c  3. Other polyneuropathy Stable symptoms. - CBC with Differential/Platelet; Future - Comprehensive metabolic panel; Future - Comprehensive metabolic panel - CBC with Differential/Platelet  4. Vitamin D deficiency - VITAMIN D 25 Hydroxy (Vit-D Deficiency, Fractures); Future - VITAMIN D 25 Hydroxy (Vit-D Deficiency, Fractures)  5. B12 deficiency - Vitamin B12; Future - Folate; Future - Folate - Vitamin B12    Return for pending bloodwork.  Shingrix prescription sent in for patient.  Micheline Rough, MD

## 2020-02-15 LAB — HEMOGLOBIN A1C
Hgb A1c MFr Bld: 6.5 % of total Hgb — ABNORMAL HIGH (ref <5.7)
Mean Plasma Glucose: 140 (calc)
eAG (mmol/L): 7.7 (calc)

## 2020-02-15 LAB — CBC WITH DIFFERENTIAL/PLATELET
Absolute Monocytes: 479 cells/uL (ref 200–950)
Basophils Absolute: 17 cells/uL (ref 0–200)
Basophils Relative: 0.3 %
Eosinophils Absolute: 120 cells/uL (ref 15–500)
Eosinophils Relative: 2.1 %
HCT: 43.5 % (ref 38.5–50.0)
Hemoglobin: 14.5 g/dL (ref 13.2–17.1)
Lymphs Abs: 1853 cells/uL (ref 850–3900)
MCH: 32.2 pg (ref 27.0–33.0)
MCHC: 33.3 g/dL (ref 32.0–36.0)
MCV: 96.5 fL (ref 80.0–100.0)
MPV: 11 fL (ref 7.5–12.5)
Monocytes Relative: 8.4 %
Neutro Abs: 3232 cells/uL (ref 1500–7800)
Neutrophils Relative %: 56.7 %
Platelets: 215 10*3/uL (ref 140–400)
RBC: 4.51 10*6/uL (ref 4.20–5.80)
RDW: 12.6 % (ref 11.0–15.0)
Total Lymphocyte: 32.5 %
WBC: 5.7 10*3/uL (ref 3.8–10.8)

## 2020-02-15 LAB — LIPID PANEL
Cholesterol: 150 mg/dL (ref <200)
HDL: 39 mg/dL — ABNORMAL LOW (ref >=40)
LDL Cholesterol (Calc): 89 mg/dL (calc)
Non-HDL Cholesterol (Calc): 111 mg/dL (calc) (ref <130)
Total CHOL/HDL Ratio: 3.8 (calc) (ref <5.0)
Triglycerides: 123 mg/dL (ref <150)

## 2020-02-15 LAB — COMPREHENSIVE METABOLIC PANEL
AG Ratio: 1.5 (calc) (ref 1.0–2.5)
ALT: 8 U/L — ABNORMAL LOW (ref 9–46)
AST: 14 U/L (ref 10–35)
Albumin: 4.3 g/dL (ref 3.6–5.1)
Alkaline phosphatase (APISO): 78 U/L (ref 35–144)
BUN: 19 mg/dL (ref 7–25)
CO2: 27 mmol/L (ref 20–32)
Calcium: 9.3 mg/dL (ref 8.6–10.3)
Chloride: 106 mmol/L (ref 98–110)
Creat: 1.02 mg/dL (ref 0.70–1.18)
Globulin: 2.8 g/dL (calc) (ref 1.9–3.7)
Glucose, Bld: 117 mg/dL — ABNORMAL HIGH (ref 65–99)
Potassium: 4.2 mmol/L (ref 3.5–5.3)
Sodium: 140 mmol/L (ref 135–146)
Total Bilirubin: 1 mg/dL (ref 0.2–1.2)
Total Protein: 7.1 g/dL (ref 6.1–8.1)

## 2020-02-15 LAB — VITAMIN D 25 HYDROXY (VIT D DEFICIENCY, FRACTURES): Vit D, 25-Hydroxy: 62 ng/mL (ref 30–100)

## 2020-02-15 LAB — MICROALBUMIN / CREATININE URINE RATIO
Creatinine, Urine: 191 mg/dL (ref 20–320)
Microalb Creat Ratio: 3 mcg/mg creat (ref <30)
Microalb, Ur: 0.6 mg/dL

## 2020-02-15 LAB — FOLATE: Folate: 21.9 ng/mL

## 2020-02-15 LAB — VITAMIN B12: Vitamin B-12: 827 pg/mL (ref 200–1100)

## 2020-04-28 DIAGNOSIS — H25013 Cortical age-related cataract, bilateral: Secondary | ICD-10-CM | POA: Diagnosis not present

## 2020-04-28 DIAGNOSIS — H43813 Vitreous degeneration, bilateral: Secondary | ICD-10-CM | POA: Diagnosis not present

## 2020-04-28 DIAGNOSIS — H52223 Regular astigmatism, bilateral: Secondary | ICD-10-CM | POA: Diagnosis not present

## 2020-04-28 DIAGNOSIS — H40003 Preglaucoma, unspecified, bilateral: Secondary | ICD-10-CM | POA: Diagnosis not present

## 2020-04-29 DIAGNOSIS — D485 Neoplasm of uncertain behavior of skin: Secondary | ICD-10-CM | POA: Diagnosis not present

## 2020-04-29 DIAGNOSIS — C44629 Squamous cell carcinoma of skin of left upper limb, including shoulder: Secondary | ICD-10-CM | POA: Diagnosis not present

## 2020-04-29 DIAGNOSIS — B079 Viral wart, unspecified: Secondary | ICD-10-CM | POA: Diagnosis not present

## 2020-04-29 DIAGNOSIS — L820 Inflamed seborrheic keratosis: Secondary | ICD-10-CM | POA: Diagnosis not present

## 2020-04-29 DIAGNOSIS — L570 Actinic keratosis: Secondary | ICD-10-CM | POA: Diagnosis not present

## 2020-05-07 DIAGNOSIS — L905 Scar conditions and fibrosis of skin: Secondary | ICD-10-CM | POA: Diagnosis not present

## 2020-05-07 DIAGNOSIS — C44629 Squamous cell carcinoma of skin of left upper limb, including shoulder: Secondary | ICD-10-CM | POA: Diagnosis not present

## 2020-05-07 DIAGNOSIS — C4492 Squamous cell carcinoma of skin, unspecified: Secondary | ICD-10-CM | POA: Diagnosis not present

## 2020-05-12 DIAGNOSIS — H40003 Preglaucoma, unspecified, bilateral: Secondary | ICD-10-CM | POA: Diagnosis not present

## 2020-05-27 DIAGNOSIS — Z23 Encounter for immunization: Secondary | ICD-10-CM | POA: Diagnosis not present

## 2020-05-30 ENCOUNTER — Telehealth: Payer: Self-pay | Admitting: Family Medicine

## 2020-05-30 NOTE — Telephone Encounter (Signed)
Left message for patient to call back and schedule Medicare Annual Wellness Visit (AWV) either virtually or in office.  Last AWV 02/13/2019  Please schedule at any time with [facility] Nurse Health Advisor 1  This should be a 45 minute visit.

## 2020-06-10 DIAGNOSIS — C4442 Squamous cell carcinoma of skin of scalp and neck: Secondary | ICD-10-CM | POA: Diagnosis not present

## 2020-06-10 DIAGNOSIS — C44519 Basal cell carcinoma of skin of other part of trunk: Secondary | ICD-10-CM | POA: Diagnosis not present

## 2020-06-10 DIAGNOSIS — Z08 Encounter for follow-up examination after completed treatment for malignant neoplasm: Secondary | ICD-10-CM | POA: Diagnosis not present

## 2020-06-10 DIAGNOSIS — L538 Other specified erythematous conditions: Secondary | ICD-10-CM | POA: Diagnosis not present

## 2020-06-10 DIAGNOSIS — X32XXXA Exposure to sunlight, initial encounter: Secondary | ICD-10-CM | POA: Diagnosis not present

## 2020-06-10 DIAGNOSIS — L820 Inflamed seborrheic keratosis: Secondary | ICD-10-CM | POA: Diagnosis not present

## 2020-06-10 DIAGNOSIS — L814 Other melanin hyperpigmentation: Secondary | ICD-10-CM | POA: Diagnosis not present

## 2020-06-10 DIAGNOSIS — L821 Other seborrheic keratosis: Secondary | ICD-10-CM | POA: Diagnosis not present

## 2020-06-10 DIAGNOSIS — D485 Neoplasm of uncertain behavior of skin: Secondary | ICD-10-CM | POA: Diagnosis not present

## 2020-06-10 DIAGNOSIS — L570 Actinic keratosis: Secondary | ICD-10-CM | POA: Diagnosis not present

## 2020-06-10 DIAGNOSIS — Z85828 Personal history of other malignant neoplasm of skin: Secondary | ICD-10-CM | POA: Diagnosis not present

## 2020-06-16 DIAGNOSIS — C4442 Squamous cell carcinoma of skin of scalp and neck: Secondary | ICD-10-CM | POA: Diagnosis not present

## 2020-06-16 DIAGNOSIS — C4492 Squamous cell carcinoma of skin, unspecified: Secondary | ICD-10-CM | POA: Diagnosis not present

## 2020-06-16 DIAGNOSIS — L905 Scar conditions and fibrosis of skin: Secondary | ICD-10-CM | POA: Diagnosis not present

## 2020-06-30 DIAGNOSIS — D485 Neoplasm of uncertain behavior of skin: Secondary | ICD-10-CM | POA: Diagnosis not present

## 2020-06-30 DIAGNOSIS — L986 Other infiltrative disorders of the skin and subcutaneous tissue: Secondary | ICD-10-CM | POA: Diagnosis not present

## 2020-06-30 DIAGNOSIS — L905 Scar conditions and fibrosis of skin: Secondary | ICD-10-CM | POA: Diagnosis not present

## 2020-06-30 DIAGNOSIS — L570 Actinic keratosis: Secondary | ICD-10-CM | POA: Diagnosis not present

## 2020-06-30 DIAGNOSIS — L821 Other seborrheic keratosis: Secondary | ICD-10-CM | POA: Diagnosis not present

## 2020-06-30 DIAGNOSIS — C44519 Basal cell carcinoma of skin of other part of trunk: Secondary | ICD-10-CM | POA: Diagnosis not present

## 2020-07-02 DIAGNOSIS — Z23 Encounter for immunization: Secondary | ICD-10-CM | POA: Diagnosis not present

## 2020-09-23 ENCOUNTER — Other Ambulatory Visit: Payer: Self-pay

## 2020-09-23 ENCOUNTER — Ambulatory Visit (INDEPENDENT_AMBULATORY_CARE_PROVIDER_SITE_OTHER): Payer: Medicare Other

## 2020-09-23 VITALS — BP 130/70 | HR 76 | Temp 98.8°F | Wt 227.5 lb

## 2020-09-23 DIAGNOSIS — Z0000 Encounter for general adult medical examination without abnormal findings: Secondary | ICD-10-CM

## 2020-09-23 NOTE — Progress Notes (Addendum)
Subjective:   David Carey is a 70 y.o. male who presents for Medicare Annual/Subsequent preventive examination.  Review of Systems     Cardiac Risk Factors include: advanced age (>89men, >50 women);dyslipidemia;male gender     Objective:    Today's Vitals   09/23/20 1154  BP: 130/70  Pulse: 76  Temp: 98.8 F (37.1 C)  SpO2: 98%  Weight: 227 lb 8 oz (103.2 kg)   Body mass index is 28.44 kg/m.  Advanced Directives 09/23/2020 12/27/2017 12/23/2016 06/09/2016 05/31/2016  Does Patient Have a Medical Advance Directive? Yes Yes Yes Yes Yes  Type of Advance Directive [place];Living will [place];Living will  Copy of [place] in Chart? No - copy requested - - - -    Current Medications (verified) Outpatient Encounter Medications as of 09/23/2020  Medication Sig  . atorvastatin (LIPITOR) 40 MG tablet Take 1 tablet (40 mg total) by mouth daily.  . Cyanocobalamin (VITAMIN B 12 PO) Take 100 mcg by mouth daily.  . Magnesium Oxide 200 MG TABS Take by mouth.  . Multiple Vitamin (MULTIVITAMIN) capsule Take 1 capsule by mouth daily.  Marland Kitchen omega-3 acid ethyl esters (LOVAZA) 1 g capsule Take by mouth.  . sildenafil (REVATIO) 20 MG tablet 2-5 tabs prn for sexual activity. No more then one dose in 24 hours.   Facility-Administered Encounter Medications as of 09/23/2020  Medication  . 0.9 %  sodium chloride infusion    Allergies (verified) Patient has no known allergies.   History: Past Medical History:  Diagnosis Date  . Colon polyps   . Diverticulosis   . Erectile dysfunction   . HERPES ZOSTER 10/03/2009   Qualifier: Diagnosis of  By: Arnoldo Morale MD, Balinda Quails   . Hyperlipidemia   . Hypogonadism male    failed testosterone tx per prior PCP notes  . Kidney stones   . 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 Type 2 diabetes mellitus with unspecified complications ([place]) 0/09/4740    -with elevated BMP, hyperlipidemia, ED   Past Surgical History:  Procedure Laterality Date  . COLONOSCOPY    . POLYPECTOMY    . VASECTOMY    . WISDOM TOOTH EXTRACTION     Family History  Problem Relation Age of Onset  . Dementia Mother 83  . Heart disease Father   . Stroke Father 56  . Hyperlipidemia Father   . CAD Father   . Retinal detachment Other   . Alcohol abuse Brother   . CAD Paternal Grandfather   . Colon cancer Neg Hx   . Colon polyps Neg Hx   . Rectal cancer Neg Hx   . Stomach cancer Neg Hx    Social History   Socioeconomic History  . Marital status: Married    Spouse name: Not on file  . Number of children: Not on file  . Years of education: Not on file  . Highest education level: Not on file  Occupational History  . Not on file  Tobacco Use  . Smoking status: Former Smoker    Packs/day: 0.50    Years: 20.00    Pack years: 10.00    Types: Cigarettes    Quit date: 06/27/1994    Years since quitting: 26.2  . Smokeless tobacco: Never Used  . Tobacco comment: quit in 2002 - 14 yo   Substance and Sexual Activity  . Alcohol use:  No  . Drug use: No  . Sexual activity: Yes  Other Topics Concern  . 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 Difficulty of Paying Living Expenses: Not hard at all  Food Insecurity: No Food Insecurity  . Worried About Charity fundraiser in the Last Year: Never true  . Ran Out of Food in the Last Year: Never true  Transportation Needs: No Transportation Needs  . Lack of Transportation (Medical): No  . Lack of Transportation (Non-Medical): No  Physical Activity: Insufficiently Active  . Days of Exercise per Week: 3 days  . Minutes of Exercise per  Session: 30 min  Stress: No Stress Concern Present  . Feeling of Stress : Not at all  Social Connections: Socially Integrated  . Frequency of Communication with Friends and Family: More than three times a week  . Frequency of Social Gatherings with Friends and Family: More than three times a week  . Attends Religious Services: More than 4 times per year  . Active Member of Clubs or Organizations: Yes  . Attends Archivist Meetings: 1 to 4 times per year  . Marital Status: Married    Tobacco Counseling Counseling given: Not Answered Comment: quit in 2002 - 14 yo    Clinical Intake:  Pre-visit preparation completed: Yes  Pain : No/denies pain     BMI - recorded: 28.44 Nutritional Status: BMI 25 -29 Overweight Nutritional Risks: None Diabetes: No  How often do you need to have someone help you when you read instructions, pamphlets, or other written materials from your doctor or pharmacy?: 1 - Never  Diabetic?No  Interpreter Needed?: No  Information entered by :: Charlott Rakes, LPN   Activities of Daily Living In your present state of health, do you have any difficulty performing the following activities: 09/23/2020  Hearing? N  Vision? N  Difficulty concentrating or making decisions? N  Walking or climbing stairs? N  Dressing or bathing? N  Doing errands, shopping? N  Preparing Food and eating ? N  Using the Toilet? N  In the past six months, have you accidently leaked urine? N  Do you have problems with loss of bowel control? N  Managing your Medications? N  Managing your Finances? N  Housekeeping or managing your Housekeeping? N  Some recent data might be hidden    Patient Care Team: Caren Macadam, MD as PCP - General (Family Medicine) Bailey Mech, MD as Referring Physician (Dermatology)  Indicate any recent Medical Services you may have received from other than [facility] providers in the past year (date may be approximate).      Assessment:   This is a routine wellness examination for [place].  Hearing/Vision screen  Hearing Screening   125Hz  250Hz  500Hz  1000Hz  2000Hz  3000Hz  4000Hz  6000Hz  8000Hz   Right ear:           Left ear:           Comments: Pt denies ay hearing issues   Vision Screening Comments: Pt follows up with Dr Denton Lank for annual eye exams   Dietary issues and exercise activities discussed: Current  Exercise Habits: Home exercise routine, Type of exercise: walking, Time (Minutes): 30, Frequency (Times/Week): 3, Weekly Exercise (Minutes/Week): 90  Goals    . patient     To each goal weight of 200lb  Losing 7 to 8 % of the body weight will reduce your A1c     . Patient Stated     Continue to lose weight    . Weight (lb) < 210 lb (95.3 kg)     Wife is on weight watchers  Will stop eating bread        Depression Screen PHQ 2/9 Scores 09/23/2020 02/14/2020 12/27/2017 12/23/2016 12/23/2016 12/08/2015 08/26/2015  PHQ - 2 Score 0 0 0 0 0 0 0    Fall Risk Fall Risk  09/23/2020 06/20/2019 12/27/2017 12/23/2016 12/23/2016  Falls in the past year? 0 0 No No No  Comment - Emmi Telephone Survey: data to providers prior to load - - -  Number falls in past yr: 0 - - - -  Injury with Fall? 0 - - - -  Risk for fall due to : Impaired vision - - - -  Follow up Falls prevention discussed - - - -    FALL RISK PREVENTION PERTAINING TO THE HOME:  Any stairs in or around the home? Yes  If so, are there any without handrails? No  Home free of loose throw rugs in walkways, pet beds, electrical cords, etc? Yes  Adequate lighting in your home to reduce risk of falls? Yes   ASSISTIVE DEVICES UTILIZED TO PREVENT FALLS:  Life alert? No  Use of a cane, walker or w/c? No  Grab bars in the bathroom? Yes  Shower chair or bench in shower? Yes  Elevated toilet seat or a handicapped toilet? Yes   TIMED UP AND GO:  Was the test performed? Yes .  Length of time to ambulate 10 feet: 10 sec.   Gait steady and fast  without use of assistive device  Cognitive Function: MMSE - Mini Mental State Exam 12/27/2017 12/23/2016  Not completed: (No Data) (No Data)     6CIT Screen 09/23/2020  What Year? 0 points  What month? 0 points    Immunizations Immunization History  Administered Date(s) Administered  . Fluad Quad(high Dose 65+) 05/01/2019  . Influenza Split 07/24/2012, 06/20/2013, 06/26/2015  . Influenza Whole 04/18/2009  . Influenza, High Dose Seasonal PF 05/28/2014, 04/08/2016, 05/01/2018  . Influenza,inj,Quad PF,6+ Mos 06/20/2013  . Influenza-Unspecified 06/26/2015, 05/15/2019, 07/02/2020  . Moderna Sars-Covid-2 Vaccination 08/24/2019, 09/21/2019, 05/27/2020  . Pneumococcal Conjugate-13 02/04/2014  . Pneumococcal Polysaccharide-23 10/03/2009, 08/26/2015  . Td 02/23/1998, 08/28/2007    TDAP status: Up to date  Flu Vaccine status: Up to date  Pneumococcal vaccine status: Up to date  Covid-19 vaccine status: Completed vaccines  Qualifies for Shingles Vaccine? Yes   Zostavax completed No   Shingrix Completed?: No.    Education has been provided regarding the importance of this vaccine. Patient has been advised to call insurance company to determine out of pocket expense if they have not yet received this vaccine. Advised may also receive vaccine at local pharmacy or Health Dept. Verbalized acceptance and understanding.  Screening Tests Health Maintenance  Topic Date Due  . HEMOGLOBIN A1C  08/16/2020  . COVID-19 Vaccine (4 - Booster for Moderna series) 11/24/2020  . FOOT EXAM  02/13/2021  . URINE MICROALBUMIN  02/13/2021  . OPHTHALMOLOGY EXAM  05/12/2021  . COLONOSCOPY (Pts 45-69yrs Insurance coverage will need to be confirmed)  06/09/2021  .  TETANUS/TDAP  07/03/2022  . INFLUENZA VACCINE  Completed  . Hepatitis C Screening  Completed  . PNA vac Low Risk Adult  Completed  . HPV VACCINES  Aged Out    Health Maintenance  Health Maintenance Due  Topic Date Due  . HEMOGLOBIN A1C   08/16/2020    Colonoscopy screening: Colonoscopy Done 06/09/16  Additional Screening:  Hepatitis C Screening:  Completed 12/08/15  Vision Screening: Recommended annual ophthalmology exams for early detection of glaucoma and other disorders of the eye. Is the patient up to date with their annual eye exam?  Yes  Who is the provider or what is the name of the office in which the patient attends annual eye exams? Dr Denton Lank If pt is not established with a provider, would they like to be referred to a provider to establish care? No .   Dental Screening: Recommended annual dental exams for proper oral hygiene  Community Resource Referral / Chronic Care Management: CRR required this visit?  No   CCM required this visit?  No      Plan:     I have personally reviewed and noted the following in the patient's chart:   . Medical and social history . Use of alcohol, tobacco or illicit drugs  . Current medications and supplements . Functional ability and status . Nutritional status . Physical activity . Advanced directives . List of other physicians . Hospitalizations, surgeries, and ER visits in previous 12 months . Vitals . Screenings to include cognitive, depression, and falls . Referrals and appointments  In addition, I have reviewed and discussed with patient certain preventive protocols, quality metrics, and best practice recommendations. A written personalized care plan for preventive services as well as general preventive health recommendations were provided to patient.     Willette Brace, LPN   03/26/7914   Nurse Notes: None

## 2020-09-23 NOTE — Patient Instructions (Addendum)
David Carey , Thank you for taking time to come for your Medicare Wellness Visit. I appreciate your ongoing commitment to your health goals. Please review the following plan we discussed and let me know if I can assist you in the future.   Screening recommendations/referrals: Colonoscopy: Done 06/09/16 Recommended yearly ophthalmology/optometry visit for glaucoma screening and checkup Recommended yearly dental visit for hygiene and checkup  Vaccinations: Influenza vaccine: 07/02/20 Up to date Pneumococcal vaccine: Up to date Tdap vaccine: Up to date Shingles vaccine: Shingrix discussed. Please contact your pharmacy for coverage information.    Covid-19: Completed 1/29 & 09/21/19 Booster 05/27/20  Advanced directives: Please bring a copy of your health care power of attorney and living will to the office at your convenience.  Conditions/risks identified: Continue to lose weight    Next appointment: Follow up in one year for your annual wellness visit.   Preventive Care 72 Years and Older, Male Preventive care refers to lifestyle choices and visits with your health care provider that can promote health and wellness. What does preventive care include?  A yearly physical exam. This is also called an annual well check.  Dental exams once or twice a year.  Routine eye exams. Ask your health care provider how often you should have your eyes checked.  Personal lifestyle choices, including:  Daily care of your teeth and gums.  Regular physical activity.  Eating a healthy diet.  Avoiding tobacco and drug use.  Limiting alcohol use.  Practicing safe sex.  Taking low doses of aspirin every day.  Taking vitamin and mineral supplements as recommended by your health care provider. What happens during an annual well check? The services and screenings done by your health care provider during your annual well check will depend on your age, overall health, lifestyle risk factors, and family  history of disease. Counseling  Your health care provider may ask you questions about your:  Alcohol use.  Tobacco use.  Drug use.  Emotional well-being.  Home and relationship well-being.  Sexual activity.  Eating habits.  History of falls.  Memory and ability to understand (cognition).  Work and work Statistician. Screening  You may have the following tests or measurements:  Height, weight, and BMI.  Blood pressure.  Lipid and cholesterol levels. These may be checked every 5 years, or more frequently if you are over 41 years old.  Skin check.  Lung cancer screening. You may have this screening every year starting at age 84 if you have a 30-pack-year history of smoking and currently smoke or have quit within the past 15 years.  Fecal occult blood test (FOBT) of the stool. You may have this test every year starting at age 73.  Flexible sigmoidoscopy or colonoscopy. You may have a sigmoidoscopy every 5 years or a colonoscopy every 10 years starting at age 2.  Prostate cancer screening. Recommendations will vary depending on your family history and other risks.  Hepatitis C blood test.  Hepatitis B blood test.  Sexually transmitted disease (STD) testing.  Diabetes screening. This is done by checking your blood sugar (glucose) after you have not eaten for a while (fasting). You may have this done every 1-3 years.  Abdominal aortic aneurysm (AAA) screening. You may need this if you are a current or former smoker.  Osteoporosis. You may be screened starting at age 7 if you are at high risk. Talk with your health care provider about your test results, treatment options, and if necessary, the need for  more tests. Vaccines  Your health care provider may recommend certain vaccines, such as:  Influenza vaccine. This is recommended every year.  Tetanus, diphtheria, and acellular pertussis (Tdap, Td) vaccine. You may need a Td booster every 10 years.  Zoster vaccine.  You may need this after age 72.  Pneumococcal 13-valent conjugate (PCV13) vaccine. One dose is recommended after age 34.  Pneumococcal polysaccharide (PPSV23) vaccine. One dose is recommended after age 28. Talk to your health care provider about which screenings and vaccines you need and how often you need them. This information is not intended to replace advice given to you by your health care provider. Make sure you discuss any questions you have with your health care provider. Document Released: 08/08/2015 Document Revised: 03/31/2016 Document Reviewed: 05/13/2015 Elsevier Interactive Patient Education  2017 [place] Prevention in the Home Falls can cause injuries. They can happen to people of all ages. There are many things you can do to make your home safe and to help prevent falls. What can I do on the outside of my home?  Regularly fix the edges of walkways and driveways and fix any cracks.  Remove anything that might make you trip as you walk through a door, such as a raised step or threshold.  Trim any bushes or trees on the path to your home.  Use bright outdoor lighting.  Clear any walking paths of anything that might make someone trip, such as rocks or tools.  Regularly check to see if handrails are loose or broken. Make sure that both sides of any steps have handrails.  Any raised decks and porches should have guardrails on the edges.  Have any leaves, snow, or ice cleared regularly.  Use sand or salt on walking paths during winter.  Clean up any spills in your garage right away. This includes oil or grease spills. What can I do in the bathroom?  Use night lights.  Install grab bars by the toilet and in the tub and shower. Do not use towel bars as grab bars.  Use non-skid mats or decals in the tub or shower.  If you need to sit down in the shower, use a plastic, non-slip stool.  Keep the floor dry. Clean up any water that spills on the floor as soon  as it happens.  Remove soap buildup in the tub or shower regularly.  Attach bath mats securely with double-sided non-slip rug tape.  Do not have throw rugs and other things on the floor that can make you trip. What can I do in the bedroom?  Use night lights.  Make sure that you have a light by your bed that is easy to reach.  Do not use any sheets or blankets that are too big for your bed. They should not hang down onto the floor.  Have a firm chair that has side arms. You can use this for support while you get dressed.  Do not have throw rugs and other things on the floor that can make you trip. What can I do in the kitchen?  Clean up any spills right away.  Avoid walking on wet floors.  Keep items that you use a lot in easy-to-reach places.  If you need to reach something above you, use a strong step stool that has a grab bar.  Keep electrical cords out of the way.  Do not use floor polish or wax that makes floors slippery. If you must use wax, use non-skid  floor wax.  Do not have throw rugs and other things on the floor that can make you trip. What can I do with my stairs?  Do not leave any items on the stairs.  Make sure that there are handrails on both sides of the stairs and use them. Fix handrails that are broken or loose. Make sure that handrails are as long as the stairways.  Check any carpeting to make sure that it is firmly attached to the stairs. Fix any carpet that is loose or worn.  Avoid having throw rugs at the top or bottom of the stairs. If you do have throw rugs, attach them to the floor with carpet tape.  Make sure that you have a light switch at the top of the stairs and the bottom of the stairs. If you do not have them, ask someone to add them for you. What else can I do to help prevent falls?  Wear shoes that:  Do not have high heels.  Have rubber bottoms.  Are comfortable and fit you well.  Are closed at the toe. Do not wear sandals.  If  you use a stepladder:  Make sure that it is fully opened. Do not climb a closed stepladder.  Make sure that both sides of the stepladder are locked into place.  Ask someone to hold it for you, if possible.  Clearly mark and make sure that you can see:  Any grab bars or handrails.  First and last steps.  Where the edge of each step is.  Use tools that help you move around (mobility aids) if they are needed. These include:  Canes.  Walkers.  Scooters.  Crutches.  Turn on the lights when you go into a dark area. Replace any light bulbs as soon as they burn out.  Set up your furniture so you have a clear path. Avoid moving your furniture around.  If any of your floors are uneven, fix them.  If there are any pets around you, be aware of where they are.  Review your medicines with your doctor. Some medicines can make you feel dizzy. This can increase your chance of falling. Ask your doctor what other things that you can do to help prevent falls. This information is not intended to replace advice given to you by your health care provider. Make sure you discuss any questions you have with your health care provider. Document Released: 05/08/2009 Document Revised: 12/18/2015 Document Reviewed: 08/16/2014 Elsevier Interactive Patient Education  2017 [employer].

## 2020-10-17 ENCOUNTER — Other Ambulatory Visit: Payer: Self-pay

## 2020-10-20 ENCOUNTER — Encounter: Payer: Self-pay | Admitting: Family Medicine

## 2020-10-20 ENCOUNTER — Other Ambulatory Visit: Payer: Self-pay

## 2020-10-20 ENCOUNTER — Ambulatory Visit (INDEPENDENT_AMBULATORY_CARE_PROVIDER_SITE_OTHER): Payer: Medicare Other | Admitting: Family Medicine

## 2020-10-20 VITALS — BP 128/84 | HR 66 | Temp 98.2°F | Ht 72.75 in | Wt 224.3 lb

## 2020-10-20 DIAGNOSIS — E785 Hyperlipidemia, unspecified: Secondary | ICD-10-CM | POA: Diagnosis not present

## 2020-10-20 DIAGNOSIS — R739 Hyperglycemia, unspecified: Secondary | ICD-10-CM | POA: Diagnosis not present

## 2020-10-20 DIAGNOSIS — G6289 Other specified polyneuropathies: Secondary | ICD-10-CM

## 2020-10-20 LAB — CBC WITH DIFFERENTIAL/PLATELET
Basophils Absolute: 0 10*3/uL (ref 0.0–0.1)
Basophils Relative: 0.3 % (ref 0.0–3.0)
Eosinophils Absolute: 0.3 10*3/uL (ref 0.0–0.7)
Eosinophils Relative: 5 % (ref 0.0–5.0)
HCT: 43.4 % (ref 39.0–52.0)
Hemoglobin: 14.7 g/dL (ref 13.0–17.0)
Lymphocytes Relative: 28.1 % (ref 12.0–46.0)
Lymphs Abs: 1.6 10*3/uL (ref 0.7–4.0)
MCHC: 33.9 g/dL (ref 30.0–36.0)
MCV: 97.9 fl (ref 78.0–100.0)
Monocytes Absolute: 0.5 10*3/uL (ref 0.1–1.0)
Monocytes Relative: 8.5 % (ref 3.0–12.0)
Neutro Abs: 3.2 10*3/uL (ref 1.4–7.7)
Neutrophils Relative %: 58.1 % (ref 43.0–77.0)
Platelets: 207 10*3/uL (ref 150.0–400.0)
RBC: 4.44 Mil/uL (ref 4.22–5.81)
RDW: 12.9 % (ref 11.5–15.5)
WBC: 5.6 10*3/uL (ref 4.0–10.5)

## 2020-10-20 LAB — COMPREHENSIVE METABOLIC PANEL
ALT: 8 U/L (ref 0–53)
AST: 14 U/L (ref 0–37)
Albumin: 4.4 g/dL (ref 3.5–5.2)
Alkaline Phosphatase: 84 U/L (ref 39–117)
BUN: 14 mg/dL (ref 6–23)
CO2: 28 mEq/L (ref 19–32)
Calcium: 9.3 mg/dL (ref 8.4–10.5)
Chloride: 103 mEq/L (ref 96–112)
Creatinine, Ser: 0.91 mg/dL (ref 0.40–1.50)
GFR: 84.41 mL/min (ref >60.00)
Glucose, Bld: 124 mg/dL — ABNORMAL HIGH (ref 70–99)
Potassium: 4.8 mEq/L (ref 3.5–5.1)
Sodium: 139 mEq/L (ref 135–145)
Total Bilirubin: 0.9 mg/dL (ref 0.2–1.2)
Total Protein: 7 g/dL (ref 6.0–8.3)

## 2020-10-20 LAB — MICROALBUMIN / CREATININE URINE RATIO
Creatinine,U: 93.8 mg/dL
Microalb Creat Ratio: 0.7 mg/g (ref 0.0–30.0)
Microalb, Ur: <0.7 mg/dL (ref 0.0–1.9)

## 2020-10-20 LAB — LIPID PANEL
Cholesterol: 155 mg/dL (ref 0–200)
HDL: 36.8 mg/dL — ABNORMAL LOW (ref >39.00)
LDL Cholesterol: 91 mg/dL (ref 0–99)
NonHDL: 118.24
Total CHOL/HDL Ratio: 4
Triglycerides: 138 mg/dL (ref 0.0–149.0)
VLDL: 27.6 mg/dL (ref 0.0–40.0)

## 2020-10-20 LAB — HEMOGLOBIN A1C: Hgb A1c MFr Bld: 6.7 % — ABNORMAL HIGH (ref 4.6–6.5)

## 2020-10-20 NOTE — Progress Notes (Signed)
David Carey DOB: November 22, 1950 Encounter date: 10/20/2020  This is a 70 y.o. male who presents with Chief Complaint  Patient presents with  . Annual Exam    History of present illness:  DMII: trying to exercise 30 minn 3 days a week. Good energy level. Not checking sugars at home. Tries to eat health; limit sugars.   HL: lipitor 40mg . Doing well.   Neuropathy: not worse or better. Just tingling. Not numbness. Just sensitivity on pads of feet. Needs to set back up appointment. Not disruptive for sleep. Not painful, just more sensitive. Being barefooted just feels things more.   Hx of SCC: follows with derm regularly.  ED: sildenafil still works.  Seeing eye doc regularly. No noted deficit with hearing.  Due this year for repeat colonoscopy - he will call to set up. Thinks he might get in [place] because closer to his house.  Doesn't regularly check bp at home; he is surprised by number today. Took sudafed over weekend.    No Known Allergies Current Meds  Medication Sig  . atorvastatin (LIPITOR) 40 MG tablet Take 1 tablet (40 mg total) by mouth daily.  . Cyanocobalamin (VITAMIN B 12 PO) Take 100 mcg by mouth daily.  . Magnesium Oxide 200 MG TABS Take by mouth.  . Multiple Vitamin (MULTIVITAMIN) capsule Take 1 capsule by mouth daily.  Marland Kitchen omega-3 acid ethyl esters (LOVAZA) 1 g capsule Take by mouth.  . sildenafil (REVATIO) 20 MG tablet 2-5 tabs prn for sexual activity. No more then one dose in 24 hours.   Current Facility-Administered Medications for the 10/20/20 encounter (Office Visit) with Caren Macadam, MD  Medication  . 0.9 %  sodium chloride infusion    Review of Systems  Constitutional: Negative for activity change, appetite change, chills, fatigue, fever and unexpected weight change.  HENT: Negative for congestion, ear pain, hearing loss, sinus pressure, sinus pain, sore throat and trouble swallowing.   Eyes: Negative for pain and visual disturbance.   Respiratory: Negative for cough, chest tightness, shortness of breath and wheezing.   Cardiovascular: Negative for chest pain, palpitations and leg swelling.  Gastrointestinal: Negative for abdominal distention, abdominal pain, blood in stool, constipation, diarrhea, nausea and vomiting.  Genitourinary: Negative for decreased urine volume, difficulty urinating, dysuria, penile pain and testicular pain.  Musculoskeletal: Negative for arthralgias, back pain and joint swelling.  Skin: Negative for rash.  Neurological: Positive for numbness (increase sensitivity; see hpi). Negative for dizziness, weakness and headaches.  Hematological: Negative for adenopathy. Does not bruise/bleed easily.  Psychiatric/Behavioral: Negative for agitation, sleep disturbance and suicidal ideas. The patient is not nervous/anxious.     Objective:  BP 128/84 (BP Location: Left Arm, Patient Position: Sitting, Cuff Size: Large)   Pulse 66   Temp 98.2 F (36.8 C) (Oral)   Ht 6' 0.75" (1.848 m)   Wt 224 lb 4.8 oz (101.7 kg)   SpO2 95%   BMI 29.80 kg/m   Weight: 224 lb 4.8 oz (101.7 kg)   BP Readings from Last 3 Encounters:  10/20/20 128/84  09/23/20 130/70  02/14/20 120/80   Wt Readings from Last 3 Encounters:  10/20/20 224 lb 4.8 oz (101.7 kg)  09/23/20 227 lb 8 oz (103.2 kg)  02/14/20 227 lb (103 kg)    Physical Exam Constitutional:      General: He is not in acute distress.    Appearance: He is well-developed.  HENT:     Head: Normocephalic and atraumatic.  Right Ear: External ear normal.     Left Ear: External ear normal.     Nose: Nose normal.     Mouth/Throat:     Pharynx: No oropharyngeal exudate.  Eyes:     Conjunctiva/sclera: Conjunctivae normal.     Pupils: Pupils are equal, round, and reactive to light.  Neck:     Thyroid: No thyromegaly.  Cardiovascular:     Rate and Rhythm: Normal rate and regular rhythm.     Heart sounds: Normal heart sounds. No murmur heard. No friction rub.  No gallop.   Pulmonary:     Effort: Pulmonary effort is normal. No respiratory distress.     Breath sounds: Normal breath sounds. No stridor. No wheezing or rales.  Abdominal:     General: Bowel sounds are normal.     Palpations: Abdomen is soft.  Musculoskeletal:        General: Normal range of motion.     Cervical back: Neck supple.  Skin:    General: Skin is warm and dry.  Neurological:     Mental Status: He is alert and oriented to person, place, and time.  Psychiatric:        Behavior: Behavior normal.        Thought Content: Thought content normal.        Judgment: Judgment normal.     Assessment/Plan  1. Hyperlipidemia, unspecified hyperlipidemia type Continue Lipitor 40 mg daily, lovaza 1 g daily.  Recheck blood work today. - Comprehensive metabolic panel; Future - Lipid panel; Future - Lipid panel - Comprehensive metabolic panel  2. Hyperglycemia Continue with regular exercise, healthy eating and limiting carbohydrates - Hemoglobin A1c; Future - Microalbumin / creatinine urine ratio; Future - Microalbumin / creatinine urine ratio - Hemoglobin A1c  3. Other polyneuropathy Follows with neurology.  Symptoms are stable.  Not affecting quality of life enough that he may need to be on medication treatment. - CBC with Differential/Platelet; Future - CBC with Differential/Platelet    Return in about 6 months (around 04/22/2021) for Chronic condition visit.     Micheline Rough, MD

## 2020-10-22 ENCOUNTER — Encounter: Payer: Self-pay | Admitting: Family Medicine

## 2020-10-28 DIAGNOSIS — H43813 Vitreous degeneration, bilateral: Secondary | ICD-10-CM | POA: Diagnosis not present

## 2020-10-28 DIAGNOSIS — H40003 Preglaucoma, unspecified, bilateral: Secondary | ICD-10-CM | POA: Diagnosis not present

## 2020-10-28 DIAGNOSIS — H25013 Cortical age-related cataract, bilateral: Secondary | ICD-10-CM | POA: Diagnosis not present

## 2020-12-09 DIAGNOSIS — D485 Neoplasm of uncertain behavior of skin: Secondary | ICD-10-CM | POA: Diagnosis not present

## 2020-12-09 DIAGNOSIS — Z08 Encounter for follow-up examination after completed treatment for malignant neoplasm: Secondary | ICD-10-CM | POA: Diagnosis not present

## 2020-12-09 DIAGNOSIS — L821 Other seborrheic keratosis: Secondary | ICD-10-CM | POA: Diagnosis not present

## 2020-12-09 DIAGNOSIS — L820 Inflamed seborrheic keratosis: Secondary | ICD-10-CM | POA: Diagnosis not present

## 2020-12-09 DIAGNOSIS — X32XXXA Exposure to sunlight, initial encounter: Secondary | ICD-10-CM | POA: Diagnosis not present

## 2020-12-09 DIAGNOSIS — L538 Other specified erythematous conditions: Secondary | ICD-10-CM | POA: Diagnosis not present

## 2020-12-09 DIAGNOSIS — L814 Other melanin hyperpigmentation: Secondary | ICD-10-CM | POA: Diagnosis not present

## 2020-12-09 DIAGNOSIS — D0461 Carcinoma in situ of skin of right upper limb, including shoulder: Secondary | ICD-10-CM | POA: Diagnosis not present

## 2020-12-09 DIAGNOSIS — Z85828 Personal history of other malignant neoplasm of skin: Secondary | ICD-10-CM | POA: Diagnosis not present

## 2020-12-09 DIAGNOSIS — L570 Actinic keratosis: Secondary | ICD-10-CM | POA: Diagnosis not present

## 2020-12-30 DIAGNOSIS — D0461 Carcinoma in situ of skin of right upper limb, including shoulder: Secondary | ICD-10-CM | POA: Diagnosis not present

## 2021-01-02 DIAGNOSIS — Z23 Encounter for immunization: Secondary | ICD-10-CM | POA: Diagnosis not present

## 2021-04-09 ENCOUNTER — Other Ambulatory Visit: Payer: Self-pay | Admitting: Family Medicine

## 2021-05-07 DIAGNOSIS — H40003 Preglaucoma, unspecified, bilateral: Secondary | ICD-10-CM | POA: Diagnosis not present

## 2021-05-13 DIAGNOSIS — Z23 Encounter for immunization: Secondary | ICD-10-CM | POA: Diagnosis not present

## 2021-06-11 ENCOUNTER — Encounter: Payer: Self-pay | Admitting: Internal Medicine

## 2021-06-12 DIAGNOSIS — X32XXXA Exposure to sunlight, initial encounter: Secondary | ICD-10-CM | POA: Diagnosis not present

## 2021-06-12 DIAGNOSIS — L905 Scar conditions and fibrosis of skin: Secondary | ICD-10-CM | POA: Diagnosis not present

## 2021-06-12 DIAGNOSIS — L570 Actinic keratosis: Secondary | ICD-10-CM | POA: Diagnosis not present

## 2021-06-12 DIAGNOSIS — D485 Neoplasm of uncertain behavior of skin: Secondary | ICD-10-CM | POA: Diagnosis not present

## 2021-06-12 DIAGNOSIS — L739 Follicular disorder, unspecified: Secondary | ICD-10-CM | POA: Diagnosis not present

## 2021-06-12 DIAGNOSIS — L538 Other specified erythematous conditions: Secondary | ICD-10-CM | POA: Diagnosis not present

## 2021-06-12 DIAGNOSIS — C44529 Squamous cell carcinoma of skin of other part of trunk: Secondary | ICD-10-CM | POA: Diagnosis not present

## 2021-06-12 DIAGNOSIS — Z08 Encounter for follow-up examination after completed treatment for malignant neoplasm: Secondary | ICD-10-CM | POA: Diagnosis not present

## 2021-06-12 DIAGNOSIS — L821 Other seborrheic keratosis: Secondary | ICD-10-CM | POA: Diagnosis not present

## 2021-06-12 DIAGNOSIS — Z85828 Personal history of other malignant neoplasm of skin: Secondary | ICD-10-CM | POA: Diagnosis not present

## 2021-06-12 DIAGNOSIS — L814 Other melanin hyperpigmentation: Secondary | ICD-10-CM | POA: Diagnosis not present

## 2021-06-12 DIAGNOSIS — L820 Inflamed seborrheic keratosis: Secondary | ICD-10-CM | POA: Diagnosis not present

## 2021-06-17 ENCOUNTER — Encounter: Payer: Self-pay | Admitting: Family Medicine

## 2021-06-17 ENCOUNTER — Ambulatory Visit (INDEPENDENT_AMBULATORY_CARE_PROVIDER_SITE_OTHER): Payer: Medicare Other | Admitting: Family Medicine

## 2021-06-17 VITALS — BP 120/78 | HR 64 | Temp 98.0°F | Ht 72.75 in | Wt 215.1 lb

## 2021-06-17 DIAGNOSIS — E118 Type 2 diabetes mellitus with unspecified complications: Secondary | ICD-10-CM

## 2021-06-17 DIAGNOSIS — I10 Essential (primary) hypertension: Secondary | ICD-10-CM | POA: Diagnosis not present

## 2021-06-17 DIAGNOSIS — E785 Hyperlipidemia, unspecified: Secondary | ICD-10-CM | POA: Diagnosis not present

## 2021-06-17 DIAGNOSIS — N529 Male erectile dysfunction, unspecified: Secondary | ICD-10-CM

## 2021-06-17 DIAGNOSIS — Z86010 Personal history of colonic polyps: Secondary | ICD-10-CM

## 2021-06-17 DIAGNOSIS — G6289 Other specified polyneuropathies: Secondary | ICD-10-CM

## 2021-06-17 DIAGNOSIS — Z23 Encounter for immunization: Secondary | ICD-10-CM

## 2021-06-17 LAB — COMPREHENSIVE METABOLIC PANEL
ALT: 10 U/L (ref 0–53)
AST: 16 U/L (ref 0–37)
Albumin: 4.2 g/dL (ref 3.5–5.2)
Alkaline Phosphatase: 75 U/L (ref 39–117)
BUN: 14 mg/dL (ref 6–23)
CO2: 30 mEq/L (ref 19–32)
Calcium: 9.3 mg/dL (ref 8.4–10.5)
Chloride: 103 mEq/L (ref 96–112)
Creatinine, Ser: 0.91 mg/dL (ref 0.40–1.50)
GFR: 84.02 mL/min (ref >60.00)
Glucose, Bld: 115 mg/dL — ABNORMAL HIGH (ref 70–99)
Potassium: 3.8 mEq/L (ref 3.5–5.1)
Sodium: 139 mEq/L (ref 135–145)
Total Bilirubin: 1 mg/dL (ref 0.2–1.2)
Total Protein: 7.3 g/dL (ref 6.0–8.3)

## 2021-06-17 LAB — CBC WITH DIFFERENTIAL/PLATELET
Basophils Absolute: 0 10*3/uL (ref 0.0–0.1)
Basophils Relative: 0.3 % (ref 0.0–3.0)
Eosinophils Absolute: 0.2 10*3/uL (ref 0.0–0.7)
Eosinophils Relative: 2.9 % (ref 0.0–5.0)
HCT: 43.3 % (ref 39.0–52.0)
Hemoglobin: 14.6 g/dL (ref 13.0–17.0)
Lymphocytes Relative: 25 % (ref 12.0–46.0)
Lymphs Abs: 1.5 10*3/uL (ref 0.7–4.0)
MCHC: 33.8 g/dL (ref 30.0–36.0)
MCV: 98 fl (ref 78.0–100.0)
Monocytes Absolute: 0.5 10*3/uL (ref 0.1–1.0)
Monocytes Relative: 8.2 % (ref 3.0–12.0)
Neutro Abs: 3.8 10*3/uL (ref 1.4–7.7)
Neutrophils Relative %: 63.6 % (ref 43.0–77.0)
Platelets: 188 10*3/uL (ref 150.0–400.0)
RBC: 4.42 Mil/uL (ref 4.22–5.81)
RDW: 13 % (ref 11.5–15.5)
WBC: 5.9 10*3/uL (ref 4.0–10.5)

## 2021-06-17 LAB — LIPID PANEL
Cholesterol: 151 mg/dL (ref 0–200)
HDL: 43.4 mg/dL (ref >39.00)
LDL Cholesterol: 81 mg/dL (ref 0–99)
NonHDL: 107.3
Total CHOL/HDL Ratio: 3
Triglycerides: 133 mg/dL (ref 0.0–149.0)
VLDL: 26.6 mg/dL (ref 0.0–40.0)

## 2021-06-17 LAB — HEMOGLOBIN A1C: Hgb A1c MFr Bld: 6.6 % — ABNORMAL HIGH (ref 4.6–6.5)

## 2021-06-17 MED ORDER — LISINOPRIL 5 MG PO TABS: 5.0000 mg | ORAL_TABLET | Freq: Every day | ORAL | 1 refills | Status: DC

## 2021-06-17 MED ORDER — ATORVASTATIN CALCIUM 40 MG PO TABS: 40.0000 mg | ORAL_TABLET | Freq: Every day | ORAL | 1 refills | Status: DC

## 2021-06-17 NOTE — Patient Instructions (Signed)
Start with a half tab of lisinopril (2.5mg ) and continue to monitor blood pressures. Send me your readings in about 2-3 weeks to check in. If your pressures are not regularly less than 135/85 (with goal closer to 120/70) then go ahead and increase to a full tablet.

## 2021-06-17 NOTE — Progress Notes (Signed)
Antonietta Jewel DOB: 20-Dec-1950 Encounter date: 06/17/2021  This is a 70 y.o. male who presents with Chief Complaint  Patient presents with   Follow-up    History of present illness: Last visit with me was 09/2020 for annual exam DMII: trying to exercise 30 minn 3 days a week. Good energy level. Not checking sugars at home. Tries to eat health; limit sugars.    HL: lipitor 40mg , lovaza 1g.    Neuropathy: follows with neurology.    Hx of SCC: follows with derm regularly. Continues to see them regularly.    ED: sildenafil still works.   Seeing eye doc regularly.     Due this year for repeat colonoscopy - he was going to call to set up at last visit. Has this set up in December with Janus Molder with [facility].   Bp at home this morning 118/79. But had been noting a couple of months ago he would get tinge of headache. Started tracking bp at home. Home readings 127/77-141/77-80. Has cut out some of bread eating which has helped him with weight loss. HR remains around 59-70.   No Known Allergies Current Meds  Medication Sig   Cyanocobalamin (VITAMIN B 12 PO) Take 100 mcg by mouth daily.   lisinopril (ZESTRIL) 5 MG tablet Take 1 tablet (5 mg total) by mouth daily.   Magnesium Oxide 200 MG TABS Take by mouth.   Multiple Vitamin (MULTIVITAMIN) capsule Take 1 capsule by mouth daily.   omega-3 acid ethyl esters (LOVAZA) 1 g capsule Take by mouth.   sildenafil (REVATIO) 20 MG tablet 2-5 tabs prn for sexual activity. No more then one dose in 24 hours.   [DISCONTINUED] atorvastatin (LIPITOR) 40 MG tablet TAKE ONE TABLET BY MOUTH ONE TIME DAILY   Current Facility-Administered Medications for the 06/17/21 encounter (Office Visit) with Caren Macadam, MD  Medication   0.9 %  sodium chloride infusion    Review of Systems  Constitutional:  Negative for chills, fatigue and fever.  Respiratory:  Negative for cough, chest tightness, shortness of breath and wheezing.   Cardiovascular:   Negative for chest pain, palpitations and leg swelling.   Objective:  BP 120/78 (BP Location: Left Arm, Patient Position: Sitting, Cuff Size: Large)   Pulse 64   Temp 98 F (36.7 C) (Oral)   Ht 6' 0.75" (1.848 m)   Wt 215 lb 1.6 oz (97.6 kg)   SpO2 96%   BMI 28.57 kg/m   Weight: 215 lb 1.6 oz (97.6 kg)   BP Readings from Last 3 Encounters:  06/17/21 120/78  10/20/20 128/84  09/23/20 130/70   Wt Readings from Last 3 Encounters:  06/17/21 215 lb 1.6 oz (97.6 kg)  10/20/20 224 lb 4.8 oz (101.7 kg)  09/23/20 227 lb 8 oz (103.2 kg)    Physical Exam Constitutional:      General: He is not in acute distress.    Appearance: He is well-developed.  Cardiovascular:     Rate and Rhythm: Normal rate and regular rhythm.     Heart sounds: Normal heart sounds. No murmur heard.   No friction rub.  Pulmonary:     Effort: Pulmonary effort is normal. No respiratory distress.     Breath sounds: Normal breath sounds. No wheezing or rales.  Musculoskeletal:     Right lower leg: No edema.     Left lower leg: No edema.  Neurological:     Mental Status: He is alert and oriented to person, place,  and time.  Psychiatric:        Behavior: Behavior normal.    Assessment/Plan  1. Type 2 diabetes mellitus with unspecified complications (HCC) Recheck bloodwork. Diet controlled.  - Hemoglobin A1c; Future  2. Other polyneuropathy Stable symptoms. Continue with B12 supplementation.   3. Erectile dysfunction, unspecified erectile dysfunction type Sildenafil prn.   4. History of colonic polyps He has set up colonoscopy in December closer to his house.   5. Hyperlipidemia, unspecified hyperlipidemia type Currently on lipitor 40mg ; has been well controlled.  - Lipid panel; Future  6. Hypertension, unspecified type Starting lisinopril. Discussed new medication(s) today with patient. Discussed potential side effects and patient verbalized understanding. Continue to monitor home pressures.  -  lisinopril (ZESTRIL) 5 MG tablet; Take 1 tablet (5 mg total) by mouth daily.  Dispense: 90 tablet; Refill: 1 - CBC with Differential/Platelet; Future - Comprehensive metabolic panel; Future  7. Need for pneumococcal vaccination - Pneumococcal conjugate vaccine 20-valent (Prevnar 20); Future   Return in about 6 months (around 12/15/2021) for physical exam.     Micheline Rough, MD

## 2021-07-08 DIAGNOSIS — L905 Scar conditions and fibrosis of skin: Secondary | ICD-10-CM | POA: Diagnosis not present

## 2021-07-08 DIAGNOSIS — C44529 Squamous cell carcinoma of skin of other part of trunk: Secondary | ICD-10-CM | POA: Diagnosis not present

## 2021-09-29 ENCOUNTER — Ambulatory Visit (INDEPENDENT_AMBULATORY_CARE_PROVIDER_SITE_OTHER): Payer: Medicare Other

## 2021-09-29 VITALS — BP 114/70 | HR 66 | Temp 98.7°F | Ht 73.0 in | Wt 218.6 lb

## 2021-09-29 DIAGNOSIS — Z0000 Encounter for general adult medical examination without abnormal findings: Secondary | ICD-10-CM

## 2021-09-29 NOTE — Progress Notes (Signed)
This visit occurred during the SARS-CoV-2 public health emergency.  Safety protocols were in place, including screening questions prior to the visit, additional usage of staff PPE, and extensive cleaning of exam room while observing appropriate contact time as indicated for disinfecting solutions.  Subjective:   MERLEN GURRY is a 71 y.o. male who presents for Medicare Annual/Subsequent preventive examination.  Review of Systems     Cardiac Risk Factors include: advanced age (>68mn, >>55women);diabetes mellitus;dyslipidemia;male gender     Objective:    Today's Vitals   09/29/21 0958  BP: 114/70  Pulse: 66  Temp: 98.7 F (37.1 C)  TempSrc: Oral  SpO2: 97%  Weight: 218 lb 9.6 oz (99.2 kg)  Height: '6\' 1"'$  (1.854 m)   Body mass index is 28.84 kg/m.  Advanced Directives 09/29/2021 09/23/2020 12/27/2017 12/23/2016 06/09/2016 05/31/2016  Does Patient Have a Medical Advance Directive? Yes Yes Yes Yes Yes Yes  Type of AParamed[place]of [place]eeples ValleyLiving will [place] will [place] CityLiving will  Copy of [place]in Chart? No - copy requested No - copy requested - - - -    Current Medications (verified) Outpatient Encounter Medications as of 09/29/2021  Medication Sig   atorvastatin (LIPITOR) 40 MG tablet Take 1 tablet (40 mg total) by mouth daily.   Cyanocobalamin (VITAMIN B 12 PO) Take 100 mcg by mouth daily.   lisinopril (ZESTRIL) 5 MG tablet Take 1 tablet (5 mg total) by mouth daily.   Magnesium Oxide 200 MG TABS Take by mouth.   Multiple Vitamin (MULTIVITAMIN) capsule Take 1 capsule by mouth daily.   omega-3 acid ethyl esters (LOVAZA) 1 g capsule Take by mouth.   sildenafil (REVATIO) 20 MG tablet 2-5 tabs prn for sexual activity. No more then one dose in 24 hours.   Facility-Administered Encounter Medications as of 09/29/2021  Medication   0.9 %  sodium chloride infusion     Allergies (verified) Patient has no known allergies.   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   Type 2 diabetes mellitus with unspecified complications ([place] 20/03/6282  -with elevated BMP, hyperlipidemia, ED   Past Surgical History:  Procedure Laterality Date   COLONOSCOPY     POLYPECTOMY     VASECTOMY     WISDOM TOOTH EXTRACTION     Family History  Problem Relation Age of Onset   Dementia Mother 836  Heart disease Father    Stroke Father 842  Hyperlipidemia Father    CAD Father    Retinal detachment Other    Alcohol abuse Brother    CAD Paternal Grandfather    Colon cancer Neg Hx    Colon polyps Neg Hx    Rectal cancer Neg Hx    Stomach cancer Neg Hx    Social History   Socioeconomic History   Marital status: Married    Spouse name: Not on file   Number of children: Not on file   Years of education: Not on file  Highest education level: Bachelor's degree (e.g., BA, AB, BS)  Occupational History   Not on file  Tobacco Use   Smoking status: Former    Packs/day: 0.50    Years: 20.00    Pack years: 10.00    Types: Cigarettes    Quit date: 06/27/1994    Years since quitting: 27.2   Smokeless tobacco: Never   Tobacco comments:    quit in 2002 - 14 yo   Vaping Use   Vaping Use: Never used  Substance and Sexual Activity   Alcohol use: No   Drug use: No   Sexual activity: Yes  Other Topics Concern   Not on file  Social History Narrative   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 [place] Situation: lives with wife      Spiritual Beliefs: Baptist      Lifestyle: no regular exercise,  diet is so so      Social Determinants of Radio broadcast assistant Strain: Low Risk    Difficulty of Paying Living Expenses: Not hard at all  Food Insecurity: No Food Insecurity   Worried About Charity fundraiser in the Last Year: Never true   Arboriculturist in the Last Year: Never true  Transportation Needs: No Transportation Needs   Lack of Transportation (Medical): No   Lack of Transportation (Non-Medical): No  Physical Activity: Sufficiently Active   Days of Exercise per Week: 3 days   Minutes of Exercise per Session: 60 min  Stress: No Stress Concern Present   Feeling of Stress : Not at all  Social Connections: Socially Integrated   Frequency of Communication with Friends and Family: More than three times a week   Frequency of Social Gatherings with Friends and Family: More than three times a week   Attends Religious Services: More than 4 times per year   Active Member of [employer] or Organizations: Yes   Attends Music therapist: More than 4 times per year   Marital Status: Married    Tobacco Counseling Counseling given: Not Answered Tobacco comments: quit in 2002 - 14 yo    Clinical Intake:  Pre-visit preparation completed: Yes        Nutritional Status: BMI 25 -29 Overweight Nutritional Risks: None Diabetes: Yes  How often do you need to have someone help you when you read instructions, pamphlets, or other written materials from your doctor or pharmacy?: 1 - Never  Diabetic? Yes Nutrition Risk Assessment:  Has the patient had any N/V/D within the last 2 months?  No  Does the patient have any non-healing wounds?  No  Has the patient had any unintentional weight loss or weight gain?  No   Diabetes:  Is the patient diabetic?  Yes  If diabetic, was a CBG obtained today?  No  Did the patient bring in their glucometer from home?  No  How often do you monitor your CBG's? Does not.   Financial Strains and Diabetes Management:  Are you having  any financial strains with the device, your supplies or your medication? No .  Does the patient want to be seen by Chronic Care Management for management of their diabetes?  No  Would the patient like to be referred to a Nutritionist or for Diabetic Management?  No   Diabetic Exams:  Diabetic Eye Exam: Completed 10/24/2020 Diabetic Foot Exam: Overdue, Pt has been advised about the importance in  completing this exam. Pt is scheduled for diabetic foot exam on next appointment.   Interpreter Needed?: No  Information entered by :: NAllen LPN   Activities of Daily Living In your present state of health, do you have any difficulty performing the following activities: 09/29/2021  Hearing? N  Vision? N  Difficulty concentrating or making decisions? N  Walking or climbing stairs? N  Dressing or bathing? N  Doing errands, shopping? N  Preparing Food and eating ? N  Using the Toilet? N  In the past six months, have you accidently leaked urine? N  Do you have problems with loss of bowel control? N  Managing your Medications? N  Managing your Finances? N  Housekeeping or managing your Housekeeping? N  Some recent data might be hidden    Patient Care Team: Caren Macadam, MD as PCP - General (Family Medicine) Bailey Mech, MD as Referring Physician (Dermatology)  Indicate any recent Medical Services you may have received from other than [facility] providers in the past year (date may be approximate).     Assessment:   This is a routine wellness examination for Charles.  Hearing/Vision screen Vision Screening - Comments:: Regular eye exams, Dr. Denton Lank  Dietary issues and exercise activities discussed: Current Exercise Habits: Home exercise routine, Type of exercise: treadmill, Time (Minutes): 60, Frequency (Times/Week): 3, Weekly Exercise (Minutes/Week): 180   Goals Addressed             This Visit's Progress    Patient Stated       09/29/2021, continue to lose weight        Depression Screen PHQ 2/9 Scores 09/29/2021 06/17/2021 09/23/2020 02/14/2020 12/27/2017 12/23/2016 12/23/2016  PHQ - 2 Score 0 0 0 0 0 0 0  PHQ- 9 Score - 0 - - - - -    Fall Risk Fall Risk  09/29/2021 06/16/2021 10/20/2020 09/23/2020 06/20/2019  Falls in the past year? 0 0 0 0 0  Comment - - - - Emmi Telephone Survey: data to providers prior to load  Number falls in past yr: - - 0 0 -  Injury with Fall? - - - 0 -  Risk for fall due to : Medication side effect - - Impaired vision -  Follow up Education provided;Falls evaluation completed;Falls prevention discussed - - Falls prevention discussed -    FALL RISK PREVENTION PERTAINING TO THE HOME:  Any stairs in or around the home? Yes  If so, are there any without handrails? No  Home free of loose throw rugs in walkways, pet beds, electrical cords, etc? Yes  Adequate lighting in your home to reduce risk of falls? Yes   ASSISTIVE DEVICES UTILIZED TO PREVENT FALLS:  Life alert? No  Use of a cane, walker or w/c? No  Grab bars in the bathroom? Yes  Shower chair or bench in shower? Yes  Elevated toilet seat or a handicapped toilet? Yes   TIMED UP AND GO:  Was the test performed? No .    Gait steady and fast without use of assistive device  Cognitive Function: MMSE - Mini Mental State Exam 12/27/2017 12/23/2016  Not completed: (No Data) (No Data)     6CIT Screen 09/29/2021 09/23/2020  What Year? 0 points 0 points  What month? 0 points 0 points  What time? 0 points -  Count back from 20 0 points -  Months in reverse 0 points -  Repeat phrase 4 points -  Total Score 4 -  Immunizations Immunization History  Administered Date(s) Administered   Fluad Quad(high Dose 65+) 05/01/2019   Influenza Split 07/24/2012, 06/20/2013, 06/26/2015   Influenza Whole 04/18/2009   Influenza, High Dose Seasonal PF 05/28/2014, 04/08/2016, 05/01/2018   Influenza,inj,Quad PF,6+ Mos 06/20/2013   Influenza-Unspecified 06/26/2015, 05/15/2019, 07/02/2020,  05/13/2021   Moderna Sars-Covid-2 Vaccination 08/21/2019, 09/21/2019, 05/27/2020, 01/02/2021   PNEUMOCOCCAL CONJUGATE-20 06/17/2021   Pneumococcal Conjugate-13 02/04/2014   Pneumococcal Polysaccharide-23 10/03/2009, 08/26/2015   Td 02/23/1998, 08/28/2007    TDAP status: Up to date  Flu Vaccine status: Up to date  Pneumococcal vaccine status: Up to date  Covid-19 vaccine status: Completed vaccines  Qualifies for Shingles Vaccine? Yes   Zostavax completed No   Shingrix Completed?: No.    Education has been provided regarding the importance of this vaccine. Patient has been advised to call insurance company to determine out of pocket expense if they have not yet received this vaccine. Advised may also receive vaccine at local pharmacy or Health Dept. Verbalized acceptance and understanding.  Screening Tests Health Maintenance  Topic Date Due   Zoster Vaccines- Shingrix (1 of 2) Never done   FOOT EXAM  02/13/2021   COVID-19 Vaccine (5 - Booster for Moderna series) 02/27/2021   COLONOSCOPY (Pts 45-33yr Insurance coverage will need to be confirmed)  06/09/2021   OPHTHALMOLOGY EXAM  10/24/2021   HEMOGLOBIN A1C  12/15/2021   TETANUS/TDAP  07/03/2022   Pneumonia Vaccine 71 Years old  Completed   INFLUENZA VACCINE  Completed   Hepatitis C Screening  Completed   HPV VACCINES  Aged Out    Health Maintenance  Health Maintenance Due  Topic Date Due   Zoster Vaccines- Shingrix (1 of 2) Never done   FOOT EXAM  02/13/2021   COVID-19 Vaccine (5 - Booster for Moderna series) 02/27/2021   COLONOSCOPY (Pts 45-453yrInsurance coverage will need to be confirmed)  06/09/2021    Colorectal cancer screening: Type of screening: Colonoscopy. Completed 06/09/2016. Repeat every 5 years  Lung Cancer Screening: (Low Dose CT Chest recommended if Age 10136-80ears, 30 pack-year currently smoking OR have quit w/in 15years.) does not qualify.   Lung Cancer Screening Referral: no  Additional  Screening:  Hepatitis C Screening: does qualify; Completed 12/08/2015  Vision Screening: Recommended annual ophthalmology exams for early detection of glaucoma and other disorders of the eye. Is the patient up to date with their annual eye exam?  Yes  Who is the provider or what is the name of the office in which the patient attends annual eye exams? Dr. WaVerl Blalockf pt is not established with a provider, would they like to be referred to a provider to establish care? No .   Dental Screening: Recommended annual dental exams for proper oral hygiene  Community Resource Referral / Chronic Care Management: CRR required this visit?  No   CCM required this visit?  No      Plan:     I have personally reviewed and noted the following in the patients chart:   Medical and social history Use of alcohol, tobacco or illicit drugs  Current medications and supplements including opioid prescriptions. Patient is not currently taking opioid prescriptions. Functional ability and status Nutritional status Physical activity Advanced directives List of other physicians Hospitalizations, surgeries, and ER visits in previous 12 months Vitals Screenings to include cognitive, depression, and falls Referrals and appointments  In addition, I have reviewed and discussed with patient certain preventive protocols, quality metrics, and best practice recommendations. A written personalized  care plan for preventive services as well as general preventive health recommendations were provided to patient.     Kellie Simmering, LPN   01/26/2255   Nurse Notes: none

## 2021-09-29 NOTE — Patient Instructions (Signed)
[name] , Thank you for taking time to come for your Medicare Wellness Visit. I appreciate your ongoing commitment to your health goals. Please review the following plan we discussed and let me know if I can assist you in the future.   Screening recommendations/referrals: Colonoscopy: completed 06/09/2016, due now Recommended yearly ophthalmology/optometry visit for glaucoma screening and checkup Recommended yearly dental visit for hygiene and checkup  Vaccinations: Influenza vaccine: completed 05/13/2021, due next flu season Pneumococcal vaccine: completed 06/17/2021 Tdap vaccine: completed 07/03/2012, due 07/03/2022 Shingles vaccine: discussed   Covid-19:  01/02/2021, 05/27/2020, 09/21/2019, 08/21/2019  Advanced directives: Please bring a copy of your POA (Power of Attorney) and/or Living Will to your next appointment.    Conditions/risks identified: none  Next appointment: Follow up in one year for your annual wellness visit.   Preventive Care 3 Years and Older, Male Preventive care refers to lifestyle choices and visits with your health care provider that can promote health and wellness. What does preventive care include? A yearly physical exam. This is also called an annual well check. Dental exams once or twice a year. Routine eye exams. Ask your health care provider how often you should have your eyes checked. Personal lifestyle choices, including: Daily care of your teeth and gums. Regular physical activity. Eating a healthy diet. Avoiding tobacco and drug use. Limiting alcohol use. Practicing safe sex. Taking low doses of aspirin every day. Taking vitamin and mineral supplements as recommended by your health care provider. What happens during an annual well check? The services and screenings done by your health care provider during your annual well check will depend on your age, overall health, lifestyle risk factors, and family history of disease. Counseling  Your  health care provider may ask you questions about your: Alcohol use. Tobacco use. Drug use. Emotional well-being. Home and relationship well-being. Sexual activity. Eating habits. History of falls. Memory and ability to understand (cognition). Work and work Statistician. Screening  You may have the following tests or measurements: Height, weight, and BMI. Blood pressure. Lipid and cholesterol levels. These may be checked every 5 years, or more frequently if you are over 10 years old. Skin check. Lung cancer screening. You may have this screening every year starting at age 57 if you have a 30-pack-year history of smoking and currently smoke or have quit within the past 15 years. Fecal occult blood test (FOBT) of the stool. You may have this test every year starting at age 53. Flexible sigmoidoscopy or colonoscopy. You may have a sigmoidoscopy every 5 years or a colonoscopy every 10 years starting at age 69. Prostate cancer screening. Recommendations will vary depending on your family history and other risks. Hepatitis C blood test. Hepatitis B blood test. Sexually transmitted disease (STD) testing. Diabetes screening. This is done by checking your blood sugar (glucose) after you have not eaten for a while (fasting). You may have this done every 1-3 years. Abdominal aortic aneurysm (AAA) screening. You may need this if you are a current or former smoker. Osteoporosis. You may be screened starting at age 63 if you are at high risk. Talk with your health care provider about your test results, treatment options, and if necessary, the need for more tests. Vaccines  Your health care provider may recommend certain vaccines, such as: Influenza vaccine. This is recommended every year. Tetanus, diphtheria, and acellular pertussis (Tdap, Td) vaccine. You may need a Td booster every 10 years. Zoster vaccine. You may need this after age 58. Pneumococcal  13-valent conjugate (PCV13) vaccine. One dose  is recommended after age 59. Pneumococcal polysaccharide (PPSV23) vaccine. One dose is recommended after age 69. Talk to your health care provider about which screenings and vaccines you need and how often you need them. This information is not intended to replace advice given to you by your health care provider. Make sure you discuss any questions you have with your health care provider. Document Released: 08/08/2015 Document Revised: 03/31/2016 Document Reviewed: 05/13/2015 Elsevier Interactive Patient Education  2017 [employer] Prevention in the Home Falls can cause injuries. They can happen to people of all ages. There are many things you can do to make your home safe and to help prevent falls. What can I do on the outside of my home? Regularly fix the edges of walkways and driveways and fix any cracks. Remove anything that might make you trip as you walk through a door, such as a raised step or threshold. Trim any bushes or trees on the path to your home. Use bright outdoor lighting. Clear any walking paths of anything that might make someone trip, such as rocks or tools. Regularly check to see if handrails are loose or broken. Make sure that both sides of any steps have handrails. Any raised decks and porches should have guardrails on the edges. Have any leaves, snow, or ice cleared regularly. Use sand or salt on walking paths during winter. Clean up any spills in your garage right away. This includes oil or grease spills. What can I do in the bathroom? Use night lights. Install grab bars by the toilet and in the tub and shower. Do not use towel bars as grab bars. Use non-skid mats or decals in the tub or shower. If you need to sit down in the shower, use a plastic, non-slip stool. Keep the floor dry. Clean up any water that spills on the floor as soon as it happens. Remove soap buildup in the tub or shower regularly. Attach bath mats securely with double-sided non-slip rug  tape. Do not have throw rugs and other things on the floor that can make you trip. What can I do in the bedroom? Use night lights. Make sure that you have a light by your bed that is easy to reach. Do not use any sheets or blankets that are too big for your bed. They should not hang down onto the floor. Have a firm chair that has side arms. You can use this for support while you get dressed. Do not have throw rugs and other things on the floor that can make you trip. What can I do in the kitchen? Clean up any spills right away. Avoid walking on wet floors. Keep items that you use a lot in easy-to-reach places. If you need to reach something above you, use a strong step stool that has a grab bar. Keep electrical cords out of the way. Do not use floor polish or wax that makes floors slippery. If you must use wax, use non-skid floor wax. Do not have throw rugs and other things on the floor that can make you trip. What can I do with my stairs? Do not leave any items on the stairs. Make sure that there are handrails on both sides of the stairs and use them. Fix handrails that are broken or loose. Make sure that handrails are as long as the stairways. Check any carpeting to make sure that it is firmly attached to the stairs. Fix any carpet  that is loose or worn. Avoid having throw rugs at the top or bottom of the stairs. If you do have throw rugs, attach them to the floor with carpet tape. Make sure that you have a light switch at the top of the stairs and the bottom of the stairs. If you do not have them, ask someone to add them for you. What else can I do to help prevent falls? Wear shoes that: Do not have high heels. Have rubber bottoms. Are comfortable and fit you well. Are closed at the toe. Do not wear sandals. If you use a stepladder: Make sure that it is fully opened. Do not climb a closed stepladder. Make sure that both sides of the stepladder are locked into place. Ask someone to  hold it for you, if possible. Clearly mark and make sure that you can see: Any grab bars or handrails. First and last steps. Where the edge of each step is. Use tools that help you move around (mobility aids) if they are needed. These include: Canes. Walkers. Scooters. Crutches. Turn on the lights when you go into a dark area. Replace any light bulbs as soon as they burn out. Set up your furniture so you have a clear path. Avoid moving your furniture around. If any of your floors are uneven, fix them. If there are any pets around you, be aware of where they are. Review your medicines with your doctor. Some medicines can make you feel dizzy. This can increase your chance of falling. Ask your doctor what other things that you can do to help prevent falls. This information is not intended to replace advice given to you by your health care provider. Make sure you discuss any questions you have with your health care provider. Document Released: 05/08/2009 Document Revised: 12/18/2015 Document Reviewed: 08/16/2014 Elsevier Interactive Patient Education  2017 [employer].

## 2021-10-05 DIAGNOSIS — L820 Inflamed seborrheic keratosis: Secondary | ICD-10-CM | POA: Diagnosis not present

## 2021-10-05 DIAGNOSIS — C4442 Squamous cell carcinoma of skin of scalp and neck: Secondary | ICD-10-CM | POA: Diagnosis not present

## 2021-10-05 DIAGNOSIS — C44222 Squamous cell carcinoma of skin of right ear and external auricular canal: Secondary | ICD-10-CM | POA: Diagnosis not present

## 2021-10-05 DIAGNOSIS — D485 Neoplasm of uncertain behavior of skin: Secondary | ICD-10-CM | POA: Diagnosis not present

## 2021-11-09 DIAGNOSIS — K635 Polyp of colon: Secondary | ICD-10-CM | POA: Diagnosis not present

## 2021-11-09 DIAGNOSIS — D125 Benign neoplasm of sigmoid colon: Secondary | ICD-10-CM | POA: Diagnosis not present

## 2021-11-09 DIAGNOSIS — Z86010 Personal history of colonic polyps: Secondary | ICD-10-CM | POA: Diagnosis not present

## 2021-11-09 DIAGNOSIS — Z09 Encounter for follow-up examination after completed treatment for conditions other than malignant neoplasm: Secondary | ICD-10-CM | POA: Diagnosis not present

## 2021-11-11 DIAGNOSIS — C4442 Squamous cell carcinoma of skin of scalp and neck: Secondary | ICD-10-CM | POA: Diagnosis not present

## 2021-11-11 DIAGNOSIS — L905 Scar conditions and fibrosis of skin: Secondary | ICD-10-CM | POA: Diagnosis not present

## 2021-12-09 ENCOUNTER — Other Ambulatory Visit: Payer: Self-pay | Admitting: Family Medicine

## 2021-12-09 DIAGNOSIS — I10 Essential (primary) hypertension: Secondary | ICD-10-CM

## 2021-12-11 DIAGNOSIS — Z85828 Personal history of other malignant neoplasm of skin: Secondary | ICD-10-CM | POA: Diagnosis not present

## 2021-12-11 DIAGNOSIS — L728 Other follicular cysts of the skin and subcutaneous tissue: Secondary | ICD-10-CM | POA: Diagnosis not present

## 2021-12-11 DIAGNOSIS — L738 Other specified follicular disorders: Secondary | ICD-10-CM | POA: Diagnosis not present

## 2021-12-11 DIAGNOSIS — D485 Neoplasm of uncertain behavior of skin: Secondary | ICD-10-CM | POA: Diagnosis not present

## 2021-12-11 DIAGNOSIS — L821 Other seborrheic keratosis: Secondary | ICD-10-CM | POA: Diagnosis not present

## 2021-12-11 DIAGNOSIS — L111 Transient acantholytic dermatosis [Grover]: Secondary | ICD-10-CM | POA: Diagnosis not present

## 2021-12-11 DIAGNOSIS — Z08 Encounter for follow-up examination after completed treatment for malignant neoplasm: Secondary | ICD-10-CM | POA: Diagnosis not present

## 2021-12-11 DIAGNOSIS — L708 Other acne: Secondary | ICD-10-CM | POA: Diagnosis not present

## 2021-12-11 DIAGNOSIS — L988 Other specified disorders of the skin and subcutaneous tissue: Secondary | ICD-10-CM | POA: Diagnosis not present

## 2021-12-11 DIAGNOSIS — L814 Other melanin hyperpigmentation: Secondary | ICD-10-CM | POA: Diagnosis not present

## 2021-12-11 DIAGNOSIS — L570 Actinic keratosis: Secondary | ICD-10-CM | POA: Diagnosis not present

## 2021-12-11 DIAGNOSIS — L439 Lichen planus, unspecified: Secondary | ICD-10-CM | POA: Diagnosis not present

## 2021-12-11 DIAGNOSIS — X32XXXA Exposure to sunlight, initial encounter: Secondary | ICD-10-CM | POA: Diagnosis not present

## 2021-12-24 ENCOUNTER — Other Ambulatory Visit: Payer: Self-pay | Admitting: Family Medicine

## 2022-03-16 ENCOUNTER — Telehealth: Payer: Self-pay | Admitting: Family Medicine

## 2022-03-16 NOTE — Telephone Encounter (Signed)
Patient stated that he will call back in the next week to let us know whether he will stay in our office or switch to someone in [place]

## 2022-03-22 ENCOUNTER — Other Ambulatory Visit: Payer: Self-pay | Admitting: *Deleted

## 2022-03-22 MED ORDER — ATORVASTATIN CALCIUM 40 MG PO TABS: 40.0000 mg | ORAL_TABLET | Freq: Every day | ORAL | 0 refills | Status: AC

## 2022-03-22 NOTE — Telephone Encounter (Signed)
I will fill but he needs an appt by November

## 2022-05-17 DIAGNOSIS — Z125 Encounter for screening for malignant neoplasm of prostate: Secondary | ICD-10-CM | POA: Diagnosis not present

## 2022-05-17 DIAGNOSIS — R5383 Other fatigue: Secondary | ICD-10-CM | POA: Diagnosis not present

## 2022-05-17 DIAGNOSIS — N529 Male erectile dysfunction, unspecified: Secondary | ICD-10-CM | POA: Diagnosis not present

## 2022-05-17 DIAGNOSIS — R030 Elevated blood-pressure reading, without diagnosis of hypertension: Secondary | ICD-10-CM | POA: Diagnosis not present

## 2022-05-17 DIAGNOSIS — E559 Vitamin D deficiency, unspecified: Secondary | ICD-10-CM | POA: Diagnosis not present

## 2022-05-17 DIAGNOSIS — E119 Type 2 diabetes mellitus without complications: Secondary | ICD-10-CM | POA: Diagnosis not present

## 2022-05-17 DIAGNOSIS — E785 Hyperlipidemia, unspecified: Secondary | ICD-10-CM | POA: Diagnosis not present

## 2022-05-17 DIAGNOSIS — Z23 Encounter for immunization: Secondary | ICD-10-CM | POA: Diagnosis not present

## 2022-05-18 DIAGNOSIS — E119 Type 2 diabetes mellitus without complications: Secondary | ICD-10-CM | POA: Diagnosis not present

## 2022-05-18 DIAGNOSIS — E559 Vitamin D deficiency, unspecified: Secondary | ICD-10-CM | POA: Diagnosis not present

## 2022-05-18 DIAGNOSIS — Z125 Encounter for screening for malignant neoplasm of prostate: Secondary | ICD-10-CM | POA: Diagnosis not present

## 2022-05-18 DIAGNOSIS — R030 Elevated blood-pressure reading, without diagnosis of hypertension: Secondary | ICD-10-CM | POA: Diagnosis not present

## 2022-05-18 DIAGNOSIS — Z23 Encounter for immunization: Secondary | ICD-10-CM | POA: Diagnosis not present

## 2022-05-18 DIAGNOSIS — E785 Hyperlipidemia, unspecified: Secondary | ICD-10-CM | POA: Diagnosis not present

## 2022-05-18 DIAGNOSIS — R5383 Other fatigue: Secondary | ICD-10-CM | POA: Diagnosis not present

## 2022-05-18 DIAGNOSIS — N529 Male erectile dysfunction, unspecified: Secondary | ICD-10-CM | POA: Diagnosis not present

## 2022-05-21 ENCOUNTER — Telehealth: Payer: Self-pay | Admitting: *Deleted

## 2022-05-21 NOTE — Patient Instructions (Signed)
Visit Information  Thank you for taking time to visit with me today. Please don't hesitate to contact me if I can be of assistance to you.   Following are the goals we discussed today:   Goals Addressed   None     Please call the care guide team at [phone] if you need to cancel or reschedule your appointment.   If you are experiencing a Mental Health or [place] or need someone to talk to, please call the Suicide and Crisis Lifeline: 988  Patient verbalizes understanding of instructions and care plan provided today and agrees to view in [place]. Active MyChart status and patient understanding of how to access instructions and care plan via MyChart confirmed with patient.     Raina Mina, RN Care Management Coordinator [place] Office [phone]

## 2022-05-21 NOTE — Patient Outreach (Signed)
  Care Coordination   Initial Visit Note   05/21/2022 Name: DENCIL CAYSON MRN: [id number] DOB: Nov 18, 1950  AWS SHERE is a 71 y.o. year old male who sees No primary care provider on file. for primary care. I spoke with  Antonietta Jewel by phone today.  What matters to the patients health and wellness today?  na    Goals Addressed   None     SDOH assessments and interventions completed:  No     Care Coordination Interventions Activated:  Yes  Care Coordination Interventions:  Yes, provided   Explained available care management services with RN, SW and pharmacy however pt no longer with local provider office. Pt has changed provider and now with [facility] in [place].  Follow up plan: No further intervention required.   Encounter Outcome:  Pt. Visit Completed   Raina Mina, RN Care Management Coordinator [place] Office [phone]

## 2022-06-01 DIAGNOSIS — I2510 Atherosclerotic heart disease of native coronary artery without angina pectoris: Secondary | ICD-10-CM | POA: Diagnosis not present

## 2022-06-01 DIAGNOSIS — Z136 Encounter for screening for cardiovascular disorders: Secondary | ICD-10-CM | POA: Diagnosis not present

## 2022-06-14 DIAGNOSIS — L905 Scar conditions and fibrosis of skin: Secondary | ICD-10-CM | POA: Diagnosis not present

## 2022-06-14 DIAGNOSIS — X32XXXA Exposure to sunlight, initial encounter: Secondary | ICD-10-CM | POA: Diagnosis not present

## 2022-06-14 DIAGNOSIS — D2321 Other benign neoplasm of skin of right ear and external auricular canal: Secondary | ICD-10-CM | POA: Diagnosis not present

## 2022-06-14 DIAGNOSIS — Z08 Encounter for follow-up examination after completed treatment for malignant neoplasm: Secondary | ICD-10-CM | POA: Diagnosis not present

## 2022-06-14 DIAGNOSIS — L570 Actinic keratosis: Secondary | ICD-10-CM | POA: Diagnosis not present

## 2022-06-14 DIAGNOSIS — Z85828 Personal history of other malignant neoplasm of skin: Secondary | ICD-10-CM | POA: Diagnosis not present

## 2022-06-14 DIAGNOSIS — L814 Other melanin hyperpigmentation: Secondary | ICD-10-CM | POA: Diagnosis not present

## 2022-06-14 DIAGNOSIS — L821 Other seborrheic keratosis: Secondary | ICD-10-CM | POA: Diagnosis not present

## 2022-06-15 DIAGNOSIS — G629 Polyneuropathy, unspecified: Secondary | ICD-10-CM | POA: Diagnosis not present

## 2022-06-15 DIAGNOSIS — K219 Gastro-esophageal reflux disease without esophagitis: Secondary | ICD-10-CM | POA: Diagnosis not present

## 2022-06-15 DIAGNOSIS — C760 Malignant neoplasm of head, face and neck: Secondary | ICD-10-CM | POA: Diagnosis not present

## 2022-06-15 DIAGNOSIS — R17 Unspecified jaundice: Secondary | ICD-10-CM | POA: Diagnosis not present

## 2022-06-15 DIAGNOSIS — K315 Obstruction of duodenum: Secondary | ICD-10-CM | POA: Diagnosis not present

## 2022-06-15 DIAGNOSIS — K831 Obstruction of bile duct: Secondary | ICD-10-CM | POA: Diagnosis not present

## 2022-06-15 DIAGNOSIS — K7689 Other specified diseases of liver: Secondary | ICD-10-CM | POA: Diagnosis not present

## 2022-06-15 DIAGNOSIS — R630 Anorexia: Secondary | ICD-10-CM | POA: Diagnosis not present

## 2022-06-15 DIAGNOSIS — K828 Other specified diseases of gallbladder: Secondary | ICD-10-CM | POA: Diagnosis not present

## 2022-06-15 DIAGNOSIS — Z820 Family history of epilepsy and other diseases of the nervous system: Secondary | ICD-10-CM | POA: Diagnosis not present

## 2022-06-15 DIAGNOSIS — N529 Male erectile dysfunction, unspecified: Secondary | ICD-10-CM | POA: Diagnosis not present

## 2022-06-15 DIAGNOSIS — K8689 Other specified diseases of pancreas: Secondary | ICD-10-CM | POA: Diagnosis not present

## 2022-06-15 DIAGNOSIS — Z8249 Family history of ischemic heart disease and other diseases of the circulatory system: Secondary | ICD-10-CM | POA: Diagnosis not present

## 2022-06-15 DIAGNOSIS — Z87442 Personal history of urinary calculi: Secondary | ICD-10-CM | POA: Diagnosis not present

## 2022-06-15 DIAGNOSIS — K869 Disease of pancreas, unspecified: Secondary | ICD-10-CM | POA: Diagnosis not present

## 2022-06-15 DIAGNOSIS — L299 Pruritus, unspecified: Secondary | ICD-10-CM | POA: Diagnosis not present

## 2022-06-15 DIAGNOSIS — R8569 Abnormal cytological findings in specimens from other digestive organs and abdominal cavity: Secondary | ICD-10-CM | POA: Diagnosis not present

## 2022-06-15 DIAGNOSIS — Z85828 Personal history of other malignant neoplasm of skin: Secondary | ICD-10-CM | POA: Diagnosis not present

## 2022-06-15 DIAGNOSIS — Z8049 Family history of malignant neoplasm of other genital organs: Secondary | ICD-10-CM | POA: Diagnosis not present

## 2022-06-15 DIAGNOSIS — K838 Other specified diseases of biliary tract: Secondary | ICD-10-CM | POA: Diagnosis not present

## 2022-06-15 DIAGNOSIS — R140 Abdominal distension (gaseous): Secondary | ICD-10-CM | POA: Diagnosis not present

## 2022-06-15 DIAGNOSIS — Z811 Family history of alcohol abuse and dependence: Secondary | ICD-10-CM | POA: Diagnosis not present

## 2022-06-15 DIAGNOSIS — E785 Hyperlipidemia, unspecified: Secondary | ICD-10-CM | POA: Diagnosis not present

## 2022-06-15 DIAGNOSIS — E1165 Type 2 diabetes mellitus with hyperglycemia: Secondary | ICD-10-CM | POA: Diagnosis not present

## 2022-06-15 DIAGNOSIS — R634 Abnormal weight loss: Secondary | ICD-10-CM | POA: Diagnosis not present

## 2022-06-15 DIAGNOSIS — Z823 Family history of stroke: Secondary | ICD-10-CM | POA: Diagnosis not present

## 2022-06-15 DIAGNOSIS — Z87891 Personal history of nicotine dependence: Secondary | ICD-10-CM | POA: Diagnosis not present

## 2022-06-23 DIAGNOSIS — H25013 Cortical age-related cataract, bilateral: Secondary | ICD-10-CM | POA: Diagnosis not present

## 2022-06-23 DIAGNOSIS — H43813 Vitreous degeneration, bilateral: Secondary | ICD-10-CM | POA: Diagnosis not present

## 2022-06-23 DIAGNOSIS — E119 Type 2 diabetes mellitus without complications: Secondary | ICD-10-CM | POA: Diagnosis not present

## 2022-06-23 DIAGNOSIS — H40003 Preglaucoma, unspecified, bilateral: Secondary | ICD-10-CM | POA: Diagnosis not present

## 2022-06-23 DIAGNOSIS — H52223 Regular astigmatism, bilateral: Secondary | ICD-10-CM | POA: Diagnosis not present

## 2022-06-24 DIAGNOSIS — J320 Chronic maxillary sinusitis: Secondary | ICD-10-CM | POA: Diagnosis not present

## 2022-06-24 DIAGNOSIS — K8689 Other specified diseases of pancreas: Secondary | ICD-10-CM | POA: Diagnosis not present

## 2022-06-28 DIAGNOSIS — I2510 Atherosclerotic heart disease of native coronary artery without angina pectoris: Secondary | ICD-10-CM | POA: Diagnosis not present

## 2022-06-28 DIAGNOSIS — R5383 Other fatigue: Secondary | ICD-10-CM | POA: Diagnosis not present

## 2022-06-28 DIAGNOSIS — K831 Obstruction of bile duct: Secondary | ICD-10-CM | POA: Diagnosis not present

## 2022-06-28 DIAGNOSIS — R931 Abnormal findings on diagnostic imaging of heart and coronary circulation: Secondary | ICD-10-CM | POA: Diagnosis not present

## 2022-06-28 DIAGNOSIS — R140 Abdominal distension (gaseous): Secondary | ICD-10-CM | POA: Diagnosis not present

## 2022-06-28 DIAGNOSIS — E119 Type 2 diabetes mellitus without complications: Secondary | ICD-10-CM | POA: Diagnosis not present

## 2022-06-28 DIAGNOSIS — R17 Unspecified jaundice: Secondary | ICD-10-CM | POA: Diagnosis not present

## 2022-06-28 DIAGNOSIS — K8689 Other specified diseases of pancreas: Secondary | ICD-10-CM | POA: Diagnosis not present

## 2022-07-01 DIAGNOSIS — J329 Chronic sinusitis, unspecified: Secondary | ICD-10-CM | POA: Diagnosis not present

## 2022-07-01 DIAGNOSIS — J3089 Other allergic rhinitis: Secondary | ICD-10-CM | POA: Diagnosis not present

## 2022-07-01 DIAGNOSIS — J320 Chronic maxillary sinusitis: Secondary | ICD-10-CM | POA: Diagnosis not present

## 2022-07-01 DIAGNOSIS — K8689 Other specified diseases of pancreas: Secondary | ICD-10-CM | POA: Diagnosis not present

## 2022-07-05 DIAGNOSIS — I7143 Infrarenal abdominal aortic aneurysm, without rupture: Secondary | ICD-10-CM | POA: Diagnosis not present

## 2022-07-05 DIAGNOSIS — K8689 Other specified diseases of pancreas: Secondary | ICD-10-CM | POA: Diagnosis not present

## 2022-07-07 DIAGNOSIS — C259 Malignant neoplasm of pancreas, unspecified: Secondary | ICD-10-CM | POA: Diagnosis not present

## 2022-07-07 DIAGNOSIS — K8689 Other specified diseases of pancreas: Secondary | ICD-10-CM | POA: Diagnosis not present

## 2022-07-07 DIAGNOSIS — R634 Abnormal weight loss: Secondary | ICD-10-CM | POA: Diagnosis not present

## 2022-07-07 DIAGNOSIS — C257 Malignant neoplasm of other parts of pancreas: Secondary | ICD-10-CM | POA: Diagnosis not present

## 2022-07-07 DIAGNOSIS — K909 Intestinal malabsorption, unspecified: Secondary | ICD-10-CM | POA: Diagnosis not present

## 2022-07-07 DIAGNOSIS — R978 Other abnormal tumor markers: Secondary | ICD-10-CM | POA: Diagnosis not present

## 2022-07-07 DIAGNOSIS — Z79899 Other long term (current) drug therapy: Secondary | ICD-10-CM | POA: Diagnosis not present

## 2022-07-08 DIAGNOSIS — K8689 Other specified diseases of pancreas: Secondary | ICD-10-CM | POA: Diagnosis not present

## 2022-07-08 DIAGNOSIS — K831 Obstruction of bile duct: Secondary | ICD-10-CM | POA: Diagnosis not present

## 2022-07-08 DIAGNOSIS — C250 Malignant neoplasm of head of pancreas: Secondary | ICD-10-CM | POA: Diagnosis not present

## 2022-07-08 DIAGNOSIS — K315 Obstruction of duodenum: Secondary | ICD-10-CM | POA: Diagnosis not present

## 2022-07-08 DIAGNOSIS — Z4659 Encounter for fitting and adjustment of other gastrointestinal appliance and device: Secondary | ICD-10-CM | POA: Diagnosis not present

## 2022-07-14 DIAGNOSIS — J320 Chronic maxillary sinusitis: Secondary | ICD-10-CM | POA: Diagnosis not present

## 2022-07-14 DIAGNOSIS — C259 Malignant neoplasm of pancreas, unspecified: Secondary | ICD-10-CM | POA: Diagnosis not present

## 2022-07-15 DIAGNOSIS — R931 Abnormal findings on diagnostic imaging of heart and coronary circulation: Secondary | ICD-10-CM | POA: Diagnosis not present

## 2022-07-15 DIAGNOSIS — C250 Malignant neoplasm of head of pancreas: Secondary | ICD-10-CM | POA: Diagnosis not present

## 2022-07-15 DIAGNOSIS — I2510 Atherosclerotic heart disease of native coronary artery without angina pectoris: Secondary | ICD-10-CM | POA: Diagnosis not present

## 2022-07-15 DIAGNOSIS — K8689 Other specified diseases of pancreas: Secondary | ICD-10-CM | POA: Diagnosis not present

## 2022-07-15 DIAGNOSIS — E119 Type 2 diabetes mellitus without complications: Secondary | ICD-10-CM | POA: Diagnosis not present

## 2022-07-20 DIAGNOSIS — K219 Gastro-esophageal reflux disease without esophagitis: Secondary | ICD-10-CM | POA: Diagnosis not present

## 2022-07-20 DIAGNOSIS — Z85828 Personal history of other malignant neoplasm of skin: Secondary | ICD-10-CM | POA: Diagnosis not present

## 2022-07-20 DIAGNOSIS — R799 Abnormal finding of blood chemistry, unspecified: Secondary | ICD-10-CM | POA: Diagnosis not present

## 2022-07-20 DIAGNOSIS — E119 Type 2 diabetes mellitus without complications: Secondary | ICD-10-CM | POA: Diagnosis not present

## 2022-07-20 DIAGNOSIS — C259 Malignant neoplasm of pancreas, unspecified: Secondary | ICD-10-CM | POA: Diagnosis not present

## 2022-07-20 DIAGNOSIS — E785 Hyperlipidemia, unspecified: Secondary | ICD-10-CM | POA: Diagnosis not present

## 2022-07-20 DIAGNOSIS — C250 Malignant neoplasm of head of pancreas: Secondary | ICD-10-CM | POA: Diagnosis not present

## 2022-07-22 DIAGNOSIS — Z95828 Presence of other vascular implants and grafts: Secondary | ICD-10-CM | POA: Diagnosis not present

## 2022-07-22 DIAGNOSIS — Z452 Encounter for adjustment and management of vascular access device: Secondary | ICD-10-CM | POA: Diagnosis not present

## 2022-07-22 DIAGNOSIS — C259 Malignant neoplasm of pancreas, unspecified: Secondary | ICD-10-CM | POA: Diagnosis not present

## 2022-07-22 DIAGNOSIS — C250 Malignant neoplasm of head of pancreas: Secondary | ICD-10-CM | POA: Diagnosis not present

## 2024-06-25 DEATH — deceased
